# Patient Record
Sex: Female | Born: 1945 | Race: White | Hispanic: No | Marital: Married | Smoking: Former smoker
Health system: Southern US, Community
[De-identification: ages and names within clinical notes are randomized; demographics above are authoritative.]

## PROBLEM LIST (undated history)

## (undated) DIAGNOSIS — I10 Essential (primary) hypertension: Secondary | ICD-10-CM

## (undated) DIAGNOSIS — E7800 Pure hypercholesterolemia, unspecified: Secondary | ICD-10-CM

## (undated) HISTORY — DX: Essential (primary) hypertension: I10

## (undated) HISTORY — DX: Pure hypercholesterolemia, unspecified: E78.00

---

## 1979-03-01 HISTORY — PX: BACK SURGERY: SHX140

## 1998-12-04 ENCOUNTER — Ambulatory Visit (HOSPITAL_COMMUNITY): Admission: RE | Admit: 1998-12-04 | Discharge: 1998-12-04 | Payer: Self-pay | Admitting: Gastroenterology

## 1999-12-29 ENCOUNTER — Encounter: Payer: Self-pay | Admitting: Gynecology

## 1999-12-29 ENCOUNTER — Encounter: Admission: RE | Admit: 1999-12-29 | Discharge: 1999-12-29 | Payer: Self-pay | Admitting: Gynecology

## 2000-12-04 ENCOUNTER — Other Ambulatory Visit: Admission: RE | Admit: 2000-12-04 | Discharge: 2000-12-04 | Payer: Self-pay | Admitting: Gynecology

## 2002-01-09 ENCOUNTER — Other Ambulatory Visit: Admission: RE | Admit: 2002-01-09 | Discharge: 2002-01-09 | Payer: Self-pay | Admitting: Gynecology

## 2002-08-22 ENCOUNTER — Encounter: Admission: RE | Admit: 2002-08-22 | Discharge: 2002-08-22 | Payer: Self-pay | Admitting: Gynecology

## 2002-08-22 ENCOUNTER — Encounter: Payer: Self-pay | Admitting: Gynecology

## 2003-01-29 ENCOUNTER — Other Ambulatory Visit: Admission: RE | Admit: 2003-01-29 | Discharge: 2003-01-29 | Payer: Self-pay | Admitting: Gynecology

## 2004-03-24 ENCOUNTER — Other Ambulatory Visit: Admission: RE | Admit: 2004-03-24 | Discharge: 2004-03-24 | Payer: Self-pay | Admitting: Gynecology

## 2005-03-24 ENCOUNTER — Encounter: Admission: RE | Admit: 2005-03-24 | Discharge: 2005-03-24 | Payer: Self-pay | Admitting: Gynecology

## 2005-04-07 ENCOUNTER — Other Ambulatory Visit: Admission: RE | Admit: 2005-04-07 | Discharge: 2005-04-07 | Payer: Self-pay | Admitting: Gynecology

## 2005-05-11 ENCOUNTER — Inpatient Hospital Stay (HOSPITAL_COMMUNITY): Admission: RE | Admit: 2005-05-11 | Discharge: 2005-05-15 | Payer: Self-pay | Admitting: Orthopedic Surgery

## 2006-02-28 HISTORY — PX: REPLACEMENT TOTAL KNEE: SUR1224

## 2006-04-27 ENCOUNTER — Encounter: Admission: RE | Admit: 2006-04-27 | Discharge: 2006-04-27 | Payer: Self-pay | Admitting: Gynecology

## 2006-05-29 ENCOUNTER — Other Ambulatory Visit: Admission: RE | Admit: 2006-05-29 | Discharge: 2006-05-29 | Payer: Self-pay | Admitting: Gynecology

## 2007-03-19 IMAGING — CR DG CHEST 2V
2 series · 2 of 2 positions shown · non-contrast
Comparison: None.

CLINICAL DATA: Osteoarthritis the right knee. Preoperative respiratory
evaluation.

CHEST - 2 VIEW  05/06/2005:

[view not recorded (1 of 2)]
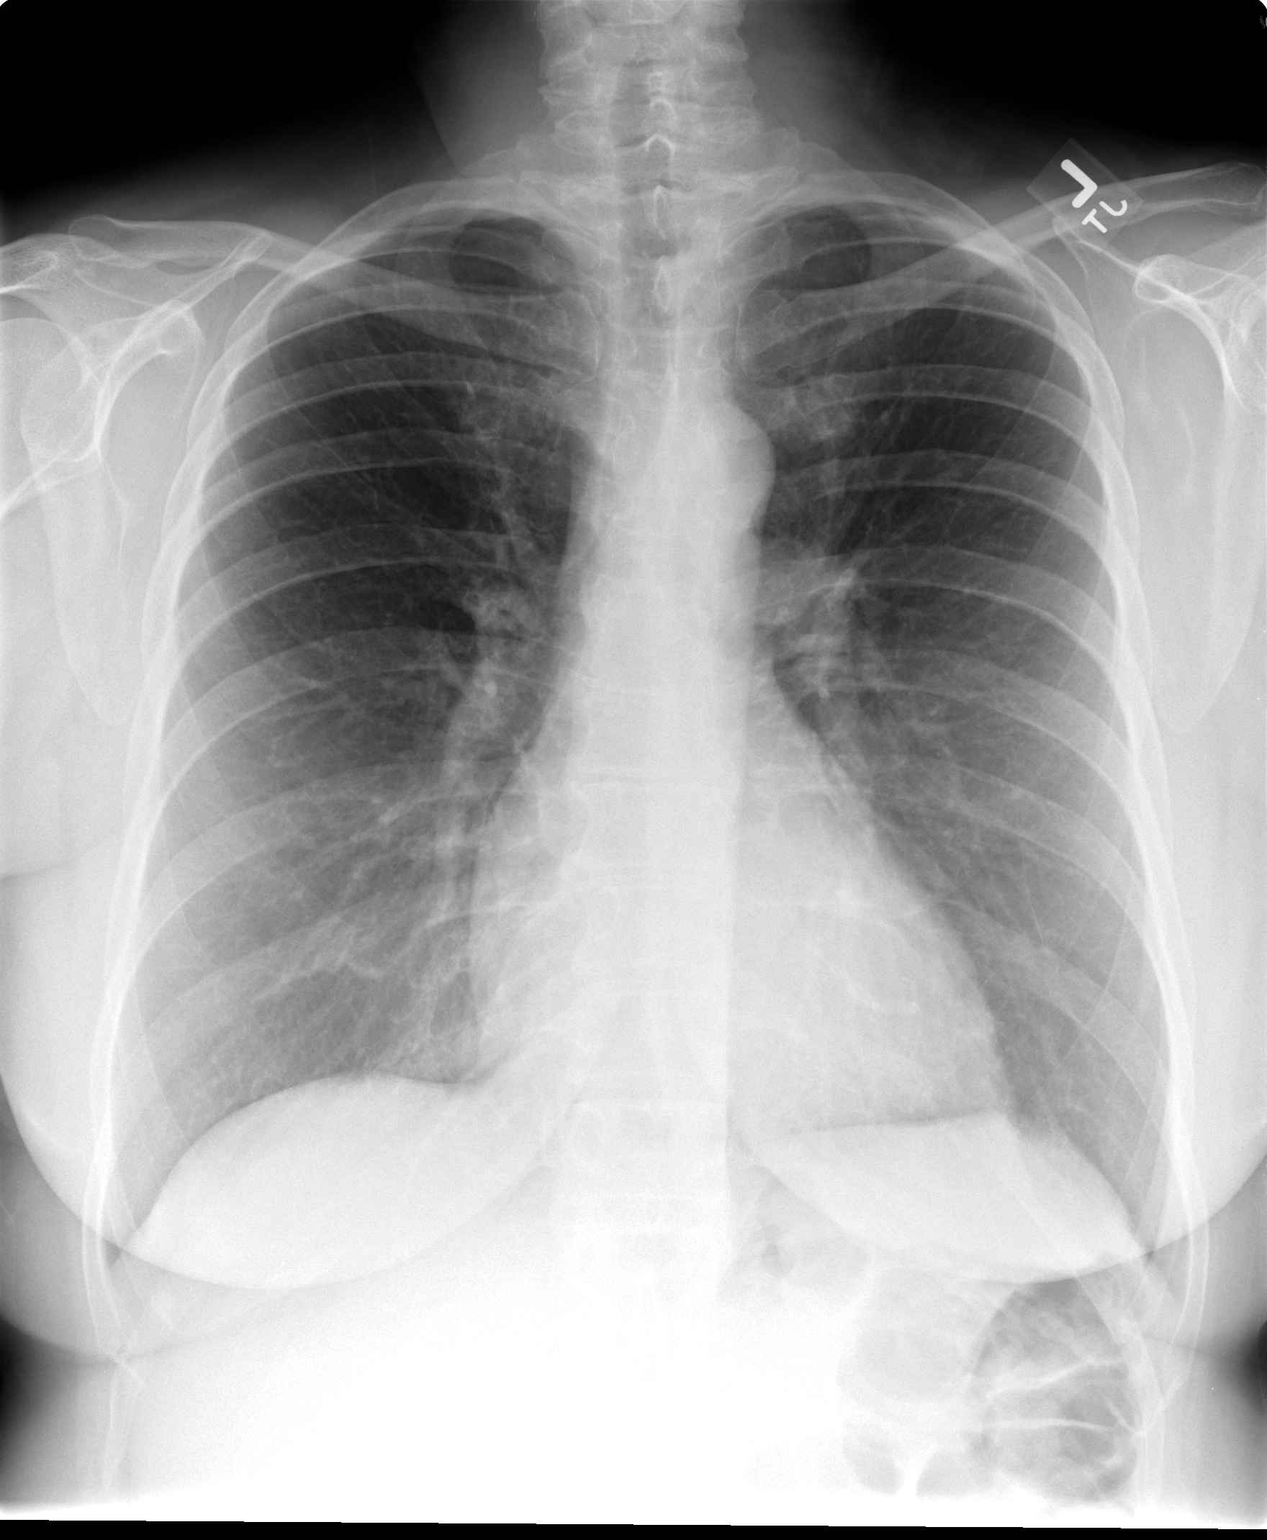

[view not recorded (2 of 2)]
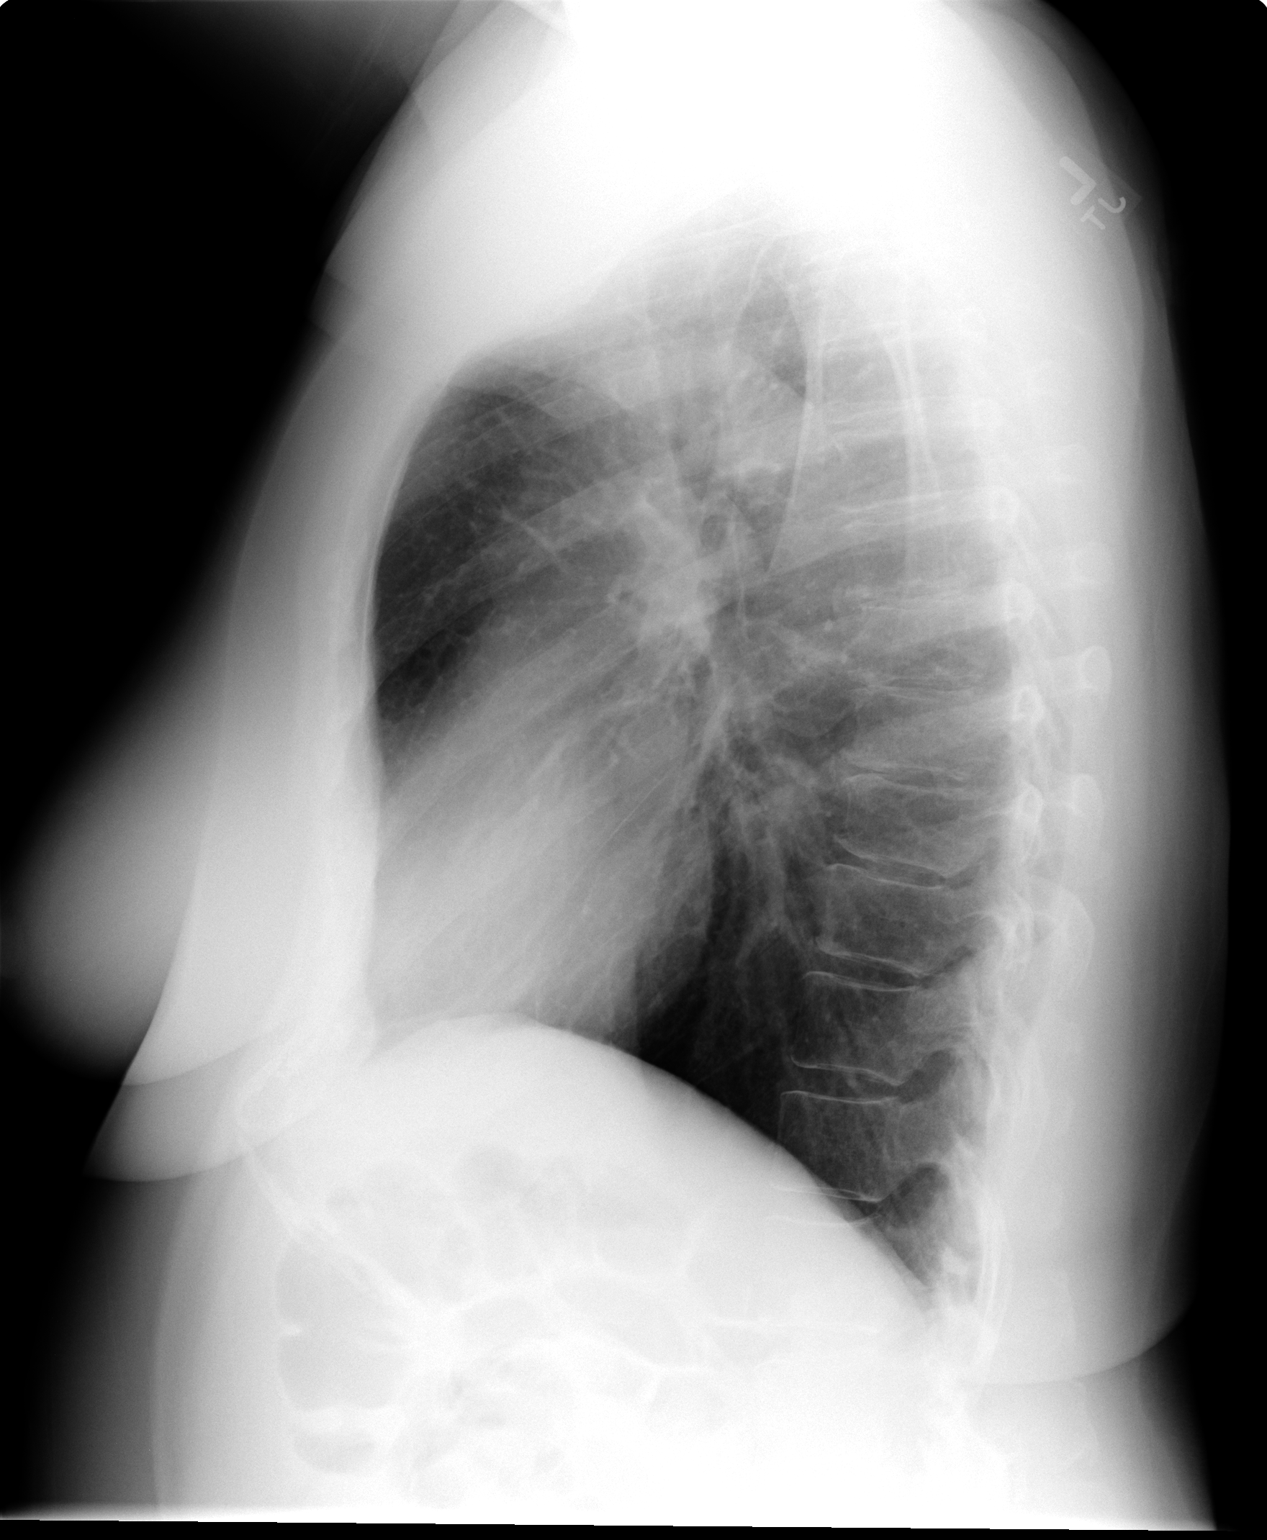

[2 of 2 positions shown; findings below may reference images not displayed]

FINDINGS: The cardiomediastinal silhouette is unremarkable. The lungs are
clear. There are no pleural effusions. Minimal degenerative changes are present
in the midthoracic spine.
IMPRESSION: No acute cardiopulmonary disease.

## 2007-06-04 ENCOUNTER — Encounter: Admission: RE | Admit: 2007-06-04 | Discharge: 2007-06-04 | Payer: Self-pay | Admitting: Gynecology

## 2008-07-07 ENCOUNTER — Encounter: Admission: RE | Admit: 2008-07-07 | Discharge: 2008-07-07 | Payer: Self-pay | Admitting: Gynecology

## 2009-07-30 ENCOUNTER — Encounter: Admission: RE | Admit: 2009-07-30 | Discharge: 2009-07-30 | Payer: Self-pay | Admitting: Gynecology

## 2010-07-16 NOTE — Discharge Summary (Signed)
NAMESANDY, HAYE             ACCOUNT NO.:  [id number]   MEDICAL RECORD NO.:  [id number]          PATIENT TYPE:  INP   LOCATION:  1616                         FACILITY:  [facility]   PHYSICIAN:  Georges Lynch. Gioffre, M.D.DATE OF BIRTH:  06-09-1945   DATE OF ADMISSION:  05/11/2005  DATE OF DISCHARGE:  05/15/2005                                 DISCHARGE SUMMARY   ADMISSION DIAGNOSES:  1.  End-stage osteoarthritis, right knee.  2.  Osteoarthritis, left knee.  3.  Hypertension.  4.  Hypothyroidism.   DISCHARGE DIAGNOSES:  1.  Right total knee arthroplasty.  2.  Asymptomatic postoperative blood loss anemia.  3.  History of osteoarthritis, left knee.  4.  History of hypertension.  5.  History of hypothyroidism.   HISTORY OF PRESENT ILLNESS:  The patient is a 65 year old female with a  several year history of progressively worsening right knee pain that bothers  her at night.  She has pain with ambulation.  She has had multiple  arthroscopies in the past without any improvement.  X-rays revealed bone-on-  bone with end-stage osteoarthritis.   ALLERGIES:  NO KNOWN DRUG ALLERGIES.   CURRENT MEDICATIONS:  1.  Hydrochlorothiazide.  2.  Levothyroxine.  3.  Citracal with calcium.  4.  Multivitamins.   SURGICAL PROCEDURE:  On April 28, 2005, the patient was taken to the OR by  Dr. Ranee Gosselin, assisted by Oneida Alar, PA-C, and underwent general  anesthesia.  The patient underwent a right total knee arthroplasty using  DePuy system.  The following components were implanted:  A size 3 femoral  component, a size 3 keeled tibial tray, a size 38 mm three-pegged patella,  size 3, 10 mm polyethylene bearing.  All components were implanted with poly  methyl methacrylate with vancomycin.  The patient tolerated the procedure  well.  There were no complications.   CONSULTS:  The following routine consults were requested:  Physical therapy,  occupational therapy, and case management.   HOSPITAL  COURSE:  On May 11, 2005, the patient was admitted to [facility] under the care of Dr. Darrelyn Hillock.  The patient was taken to the OR  where a right total knee arthroplasty was performed without any  complications.  The patient tolerated the procedure well.  She was  transferred to the recovery room and then to the orthopedic floor for  routine postop protocol for total knees on IV antibiotics, pain medicines,  start Coumadin for DVT prophylaxis and heparin subcutaneous.   The patient then incurred four days postoperative course on orthopedic floor  in which the patient did very well without any significant untoward events.  Her vital signs remained stable.  She did develop some slight postoperative  blood loss anemia with hemoglobin dropping to 9.7 but her vital signs  remained stable.  She tolerated it well with physical therapy so this was  allowed to self-correct with supplementation.  The patient's wound remained  benign without any signs of infection.  Leg remained neurovascularly intact  and the patient worked very well with physical therapy.  She was able to  transition  from IV medications to p.o. medications well without any  complications.  It was felt on postoperative day #4 she was orthopedically  and medically stable and ready for discharge home so arrangements were made  and she will continue with outpatient physical therapy per protocol.   LABS:  CBC on March 17, WBC 9.1, hemoglobin 9.1, hematocrit 26.8, platelets  210.  INR of 2 on March 18.  Routine chemistries on March 17 showed sodium  137, potassium of 3.4, glucose of 116, BUN 9, creatinine 0.8.  Her elevated  glucose was felt due to inactivity and routine postop surgical stress.   Urinalysis on preop showed that she had some cloudy urine, small leukocyte  esterase, few epithelial cells 0-2 WBC.  She was treated with routine  postoperative IV antibiotics for 24 hours.  EKG on admission was normal  sinus  rhythm.  She had a chest x-ray for preop which showed no acute  cardiopulmonary disease.   MEDICATIONS UPON DISCHARGE:  1.  Lisinopril 20 mg p.o. daily.  2.  Hydrochlorothiazide 12.5 mg daily.  3.  Levothyroxine 112 mcg per day.  4.  Multivitamin one tablet a day.  5.  Calcium carbonate with vitamin D two tablets a day.  6.  Ferrous sulfate 325 mg three times a day.  7.  Reglan 10 mg every 8 hours p.r.n.  8.  Phenergan 25 mg p.o. q.6h. p.r.n.  9.  Robaxin 500 mg p.o. q.6h. p.r.n.  10. Percocet one or two tablets every 4-6 hours p.r.n. pain.  11. Coumadin 3 mg a day.  12. Heparin had been discontinued since INR was 2.   DISCHARGE INSTRUCTIONS:  1.  Diet - no restrictions.  2.  Activity - the patient is to ambulate with assistance with the use of a      walker and as instructed by physical therapy.  3.  Wound care - the patient is to change dressings daily.  4.  Medications - the patient is to resume her previous home medications      with the addition of Coumadin 5 mg a day unless changed by [place]      pharmacist.  5.  Robaxin 500 mg one every 8 hours for muscle spasms if needed.  6.  Percocet one or two every 4-6 hours for muscle pain if needed.  7.  Follow up - with Dr. Darrelyn Hillock two weeks from surgery - please call [phone] for an appointment.  The patient      is to continue with routine outpatient total knee protocol with Genevieve Norlander      for physical therapy and Coumadin checking.   PATIENT'S CONDITION UPON DISCHARGE TO HOME:  Listed as improved and good.      Jamelle Rushing, P.A.    ______________________________  Georges Lynch Darrelyn Hillock, M.D.    RWK/MEDQ  D:  05/27/2005  T:  05/30/2005  Job:  [id number]

## 2010-07-16 NOTE — Op Note (Signed)
Lori Sandoval, FRIEND             ACCOUNT NO.:  [id number]   MEDICAL RECORD NO.:  [id number]          PATIENT TYPE:  INP   LOCATION:  1616                         FACILITY:  [facility]   PHYSICIAN:  Georges Lynch. Gioffre, M.D.DATE OF BIRTH:  1945-06-27   DATE OF PROCEDURE:  05/11/2005  DATE OF DISCHARGE:                                 OPERATIVE REPORT   Dr.   Alvy Beal:  Georges Lynch. Darrelyn Hillock, M.D.   ASSISTANT:  Jamelle Rushing, P.A.   PREOPERATIVE DIAGNOSIS:  Severe degenerative arthritis of the right knee.   POSTOPERATIVE DIAGNOSIS:  Severe degenerative arthritis of the right knee.   OPERATION:  Right total knee arthroplasty utilizing the DePuy system  utilizing a rotating platform and tibial component.  All three components  were cemented.  Vancomycin was used as cement.  The sizes used were as  follows.  The tibial tray with a size 3. The femur was a size 3 right.  Posterior cruciate sacrificing type prosthesis.  The patella was a size 38.  The tibial insert was a size 3, 10 mm thickness.   DESCRIPTION OF PROCEDURE:  Under general anesthesia, routine orthopedic  prepping and draping of the right lower was extremity carried out. She had 1  gram of IV Ancef preoperatively.  At this time, the leg  was exsanguinated  with the Esmarch.  The Tourniquet was elevated to 375 mmHg.  An anterior  approach of the left knee was carried out.  Bleeders identified and  cauterized.  Two flaps were created, and self-retaining retractors were  inserted.  I then carried out a median parapatellar approach.  I reflected  the patella laterally, flexed the knee and did lateral and medial  meniscectomies and excised the anterior and posterior cruciate ligaments.  I  then went down and did a thorough synovectomy.  She had a severe synovitis.  Following that, I then made my removed all the spurs from the femur, tibia  and patella.  I then made initial drill hole at the intercondylar notch.  At  this time  intramedullary guide wire was inserted.  We removed 12 mm  thickness off the distal femur because of her contracture.  Following that,  the #2 jig was inserted for a size 3, and we did our anterior and posterior  chamfer cuts for a size 3 right femur.  Following that, we then prepared the  tibia.  We removed 4 mm thickness off the affected side of the tibial  plateau.  We used the tibial plateau actually the lateral aspect was our  baseline because it was more worn laterally and medially.  We removed a  total 4 mm thickness off the proximal tibia.  Following that, we then cut  our keel cut in the usual fashion. We then completed our preparation of the  femur as well, cutting our notch cut out.  We thoroughly debrided out the  soft tissue.  We inspected the posterior condyles of by inserting a lamina  spreader to examine the condyles and then remove all loose fragments.  We  thoroughly water picked out the knee went  through range of motion and felt  that a 10 mm thickness size 3 tibial component was our best choice in this  case.  We had good motion.  We then prepared our patella for a size 38  patella.  We did a resurfacing type patella in the usual fashion.  The  appropriate measurements were taken.  We then made three drill holes in the  patella for a 38 mm patella.  Following that, we thoroughly water picked out  the knee after we removed the trial components.  We dried the knee out and  cemented all three components in simultaneously.  At that particular time,  all loose pieces of cement were removed.  We removed the trial tibial insert  and then examined the posterior aspect in the knee to make sure there were  no other loose fragments of bone.  We water picked out the knee again and we  then dried the knee  out and inserted our permanent size 3, 10 mm thickness tibial rotating  platform.  The knee was reduced, taken through motion. We had excellent  function.  We then inserted a  Hemovac drain and closed the knee in layers  over a Hemovac drain.  Sterile Neosporin dressings were applied.  The  patient had 1 gram of IV Ancef preoperatively.           ______________________________  Georges Lynch Darrelyn Hillock, M.D.     RAG/MEDQ  D:  05/11/2005  T:  05/12/2005  Job:  [id number]

## 2010-10-11 ENCOUNTER — Other Ambulatory Visit: Payer: Self-pay | Admitting: Gynecology

## 2010-10-11 DIAGNOSIS — Z1231 Encounter for screening mammogram for malignant neoplasm of breast: Secondary | ICD-10-CM

## 2010-10-13 ENCOUNTER — Ambulatory Visit
Admission: RE | Admit: 2010-10-13 | Discharge: 2010-10-13 | Disposition: A | Discharge status: Home / Self Care | Payer: Medicare Other | Source: Ambulatory Visit | Attending: Gynecology | Admitting: Gynecology

## 2010-10-13 DIAGNOSIS — Z1231 Encounter for screening mammogram for malignant neoplasm of breast: Secondary | ICD-10-CM

## 2011-06-07 DIAGNOSIS — H40019 Open angle with borderline findings, low risk, unspecified eye: Secondary | ICD-10-CM | POA: Diagnosis not present

## 2011-10-20 ENCOUNTER — Other Ambulatory Visit: Payer: Self-pay | Admitting: Gynecology

## 2011-10-20 DIAGNOSIS — Z1231 Encounter for screening mammogram for malignant neoplasm of breast: Secondary | ICD-10-CM

## 2011-11-02 DIAGNOSIS — Z01419 Encounter for gynecological examination (general) (routine) without abnormal findings: Secondary | ICD-10-CM | POA: Diagnosis not present

## 2011-11-02 DIAGNOSIS — N8184 Pelvic muscle wasting: Secondary | ICD-10-CM | POA: Diagnosis not present

## 2011-11-02 DIAGNOSIS — M899 Disorder of bone, unspecified: Secondary | ICD-10-CM | POA: Diagnosis not present

## 2011-11-14 ENCOUNTER — Ambulatory Visit
Admission: RE | Admit: 2011-11-14 | Discharge: 2011-11-14 | Disposition: A | Discharge status: Home / Self Care | Payer: Medicare Other | Source: Ambulatory Visit | Attending: Gynecology | Admitting: Gynecology

## 2011-11-14 DIAGNOSIS — Z1331 Encounter for screening for depression: Secondary | ICD-10-CM | POA: Diagnosis not present

## 2011-11-14 DIAGNOSIS — Z1231 Encounter for screening mammogram for malignant neoplasm of breast: Secondary | ICD-10-CM

## 2011-11-14 DIAGNOSIS — E7800 Pure hypercholesterolemia, unspecified: Secondary | ICD-10-CM | POA: Diagnosis not present

## 2011-11-14 DIAGNOSIS — M810 Age-related osteoporosis without current pathological fracture: Secondary | ICD-10-CM | POA: Diagnosis not present

## 2011-11-14 DIAGNOSIS — E039 Hypothyroidism, unspecified: Secondary | ICD-10-CM | POA: Diagnosis not present

## 2011-11-14 DIAGNOSIS — Z0000 Encounter for general adult medical examination without abnormal findings: Secondary | ICD-10-CM | POA: Diagnosis not present

## 2011-11-14 DIAGNOSIS — Z79899 Other long term (current) drug therapy: Secondary | ICD-10-CM | POA: Diagnosis not present

## 2011-11-14 DIAGNOSIS — I10 Essential (primary) hypertension: Secondary | ICD-10-CM | POA: Diagnosis not present

## 2011-11-24 DIAGNOSIS — Z23 Encounter for immunization: Secondary | ICD-10-CM | POA: Diagnosis not present

## 2012-11-15 DIAGNOSIS — E7800 Pure hypercholesterolemia, unspecified: Secondary | ICD-10-CM | POA: Diagnosis not present

## 2012-11-15 DIAGNOSIS — E039 Hypothyroidism, unspecified: Secondary | ICD-10-CM | POA: Diagnosis not present

## 2012-11-15 DIAGNOSIS — Z0000 Encounter for general adult medical examination without abnormal findings: Secondary | ICD-10-CM | POA: Diagnosis not present

## 2012-11-15 DIAGNOSIS — M810 Age-related osteoporosis without current pathological fracture: Secondary | ICD-10-CM | POA: Diagnosis not present

## 2012-11-15 DIAGNOSIS — Z23 Encounter for immunization: Secondary | ICD-10-CM | POA: Diagnosis not present

## 2012-11-26 ENCOUNTER — Other Ambulatory Visit: Payer: Self-pay

## 2012-11-26 DIAGNOSIS — Z1231 Encounter for screening mammogram for malignant neoplasm of breast: Secondary | ICD-10-CM

## 2012-11-29 DIAGNOSIS — Z23 Encounter for immunization: Secondary | ICD-10-CM | POA: Diagnosis not present

## 2012-12-17 DIAGNOSIS — H40029 Open angle with borderline findings, high risk, unspecified eye: Secondary | ICD-10-CM | POA: Diagnosis not present

## 2012-12-19 ENCOUNTER — Ambulatory Visit
Admission: RE | Admit: 2012-12-19 | Discharge: 2012-12-19 | Disposition: A | Discharge status: Home / Self Care | Payer: Medicare Other | Source: Ambulatory Visit

## 2012-12-19 DIAGNOSIS — Z1231 Encounter for screening mammogram for malignant neoplasm of breast: Secondary | ICD-10-CM | POA: Diagnosis not present

## 2012-12-19 DIAGNOSIS — M810 Age-related osteoporosis without current pathological fracture: Secondary | ICD-10-CM | POA: Diagnosis not present

## 2013-05-15 DIAGNOSIS — Z79899 Other long term (current) drug therapy: Secondary | ICD-10-CM | POA: Diagnosis not present

## 2013-05-15 DIAGNOSIS — E039 Hypothyroidism, unspecified: Secondary | ICD-10-CM | POA: Diagnosis not present

## 2013-05-15 DIAGNOSIS — M810 Age-related osteoporosis without current pathological fracture: Secondary | ICD-10-CM | POA: Diagnosis not present

## 2013-05-15 DIAGNOSIS — I10 Essential (primary) hypertension: Secondary | ICD-10-CM | POA: Diagnosis not present

## 2013-05-15 DIAGNOSIS — Z23 Encounter for immunization: Secondary | ICD-10-CM | POA: Diagnosis not present

## 2013-05-15 DIAGNOSIS — E7800 Pure hypercholesterolemia, unspecified: Secondary | ICD-10-CM | POA: Diagnosis not present

## 2013-11-19 DIAGNOSIS — E559 Vitamin D deficiency, unspecified: Secondary | ICD-10-CM | POA: Diagnosis not present

## 2013-11-19 DIAGNOSIS — E039 Hypothyroidism, unspecified: Secondary | ICD-10-CM | POA: Diagnosis not present

## 2013-11-19 DIAGNOSIS — Z0000 Encounter for general adult medical examination without abnormal findings: Secondary | ICD-10-CM | POA: Diagnosis not present

## 2013-11-19 DIAGNOSIS — I10 Essential (primary) hypertension: Secondary | ICD-10-CM | POA: Diagnosis not present

## 2013-11-19 DIAGNOSIS — E7800 Pure hypercholesterolemia, unspecified: Secondary | ICD-10-CM | POA: Diagnosis not present

## 2013-11-19 DIAGNOSIS — Z1331 Encounter for screening for depression: Secondary | ICD-10-CM | POA: Diagnosis not present

## 2013-11-19 DIAGNOSIS — M810 Age-related osteoporosis without current pathological fracture: Secondary | ICD-10-CM | POA: Diagnosis not present

## 2013-11-19 DIAGNOSIS — Z79899 Other long term (current) drug therapy: Secondary | ICD-10-CM | POA: Diagnosis not present

## 2013-12-12 DIAGNOSIS — Z23 Encounter for immunization: Secondary | ICD-10-CM | POA: Diagnosis not present

## 2014-01-02 ENCOUNTER — Other Ambulatory Visit: Payer: Self-pay

## 2014-01-02 DIAGNOSIS — Z1231 Encounter for screening mammogram for malignant neoplasm of breast: Secondary | ICD-10-CM

## 2014-01-28 ENCOUNTER — Ambulatory Visit
Admission: RE | Admit: 2014-01-28 | Discharge: 2014-01-28 | Disposition: A | Discharge status: Home / Self Care | Payer: Medicare Other | Source: Ambulatory Visit

## 2014-01-28 DIAGNOSIS — Z1231 Encounter for screening mammogram for malignant neoplasm of breast: Secondary | ICD-10-CM

## 2014-02-25 DIAGNOSIS — E780 Pure hypercholesterolemia: Secondary | ICD-10-CM | POA: Diagnosis not present

## 2014-02-25 DIAGNOSIS — E559 Vitamin D deficiency, unspecified: Secondary | ICD-10-CM | POA: Diagnosis not present

## 2014-02-25 DIAGNOSIS — M810 Age-related osteoporosis without current pathological fracture: Secondary | ICD-10-CM | POA: Diagnosis not present

## 2014-02-25 DIAGNOSIS — I10 Essential (primary) hypertension: Secondary | ICD-10-CM | POA: Diagnosis not present

## 2014-02-25 DIAGNOSIS — E039 Hypothyroidism, unspecified: Secondary | ICD-10-CM | POA: Diagnosis not present

## 2014-05-20 DIAGNOSIS — E780 Pure hypercholesterolemia: Secondary | ICD-10-CM | POA: Diagnosis not present

## 2014-05-20 DIAGNOSIS — M810 Age-related osteoporosis without current pathological fracture: Secondary | ICD-10-CM | POA: Diagnosis not present

## 2014-05-20 DIAGNOSIS — E039 Hypothyroidism, unspecified: Secondary | ICD-10-CM | POA: Diagnosis not present

## 2014-05-20 DIAGNOSIS — E559 Vitamin D deficiency, unspecified: Secondary | ICD-10-CM | POA: Diagnosis not present

## 2014-05-20 DIAGNOSIS — L989 Disorder of the skin and subcutaneous tissue, unspecified: Secondary | ICD-10-CM | POA: Diagnosis not present

## 2014-05-20 DIAGNOSIS — Z8679 Personal history of other diseases of the circulatory system: Secondary | ICD-10-CM | POA: Diagnosis not present

## 2014-06-18 DIAGNOSIS — C4492 Squamous cell carcinoma of skin, unspecified: Secondary | ICD-10-CM | POA: Diagnosis not present

## 2014-06-18 DIAGNOSIS — D485 Neoplasm of uncertain behavior of skin: Secondary | ICD-10-CM | POA: Diagnosis not present

## 2014-06-18 DIAGNOSIS — L570 Actinic keratosis: Secondary | ICD-10-CM | POA: Diagnosis not present

## 2014-06-18 DIAGNOSIS — L821 Other seborrheic keratosis: Secondary | ICD-10-CM | POA: Diagnosis not present

## 2014-10-08 DIAGNOSIS — L570 Actinic keratosis: Secondary | ICD-10-CM | POA: Diagnosis not present

## 2014-10-08 DIAGNOSIS — L719 Rosacea, unspecified: Secondary | ICD-10-CM | POA: Diagnosis not present

## 2014-10-08 DIAGNOSIS — C4492 Squamous cell carcinoma of skin, unspecified: Secondary | ICD-10-CM | POA: Diagnosis not present

## 2014-10-08 DIAGNOSIS — Z872 Personal history of diseases of the skin and subcutaneous tissue: Secondary | ICD-10-CM | POA: Diagnosis not present

## 2014-10-08 DIAGNOSIS — L821 Other seborrheic keratosis: Secondary | ICD-10-CM | POA: Diagnosis not present

## 2014-10-08 DIAGNOSIS — D485 Neoplasm of uncertain behavior of skin: Secondary | ICD-10-CM | POA: Diagnosis not present

## 2014-10-08 DIAGNOSIS — L814 Other melanin hyperpigmentation: Secondary | ICD-10-CM | POA: Diagnosis not present

## 2014-11-26 ENCOUNTER — Other Ambulatory Visit (HOSPITAL_COMMUNITY)
Admission: RE | Admit: 2014-11-26 | Discharge: 2014-11-26 | Disposition: A | Discharge status: Home / Self Care | Payer: Medicare Other | Source: Ambulatory Visit | Attending: Family Medicine | Admitting: Family Medicine

## 2014-11-26 DIAGNOSIS — Z0000 Encounter for general adult medical examination without abnormal findings: Secondary | ICD-10-CM | POA: Diagnosis not present

## 2014-11-26 DIAGNOSIS — E039 Hypothyroidism, unspecified: Secondary | ICD-10-CM | POA: Diagnosis not present

## 2014-11-26 DIAGNOSIS — Z124 Encounter for screening for malignant neoplasm of cervix: Secondary | ICD-10-CM | POA: Insufficient documentation

## 2014-11-26 DIAGNOSIS — E559 Vitamin D deficiency, unspecified: Secondary | ICD-10-CM | POA: Diagnosis not present

## 2014-11-26 DIAGNOSIS — Z1389 Encounter for screening for other disorder: Secondary | ICD-10-CM | POA: Diagnosis not present

## 2014-11-26 DIAGNOSIS — M810 Age-related osteoporosis without current pathological fracture: Secondary | ICD-10-CM | POA: Diagnosis not present

## 2014-11-26 DIAGNOSIS — I10 Essential (primary) hypertension: Secondary | ICD-10-CM | POA: Diagnosis not present

## 2014-11-26 DIAGNOSIS — E780 Pure hypercholesterolemia: Secondary | ICD-10-CM | POA: Diagnosis not present

## 2014-11-27 DIAGNOSIS — Z23 Encounter for immunization: Secondary | ICD-10-CM | POA: Diagnosis not present

## 2015-03-17 ENCOUNTER — Other Ambulatory Visit: Payer: Self-pay

## 2015-03-17 DIAGNOSIS — Z1231 Encounter for screening mammogram for malignant neoplasm of breast: Secondary | ICD-10-CM

## 2015-04-10 DIAGNOSIS — H524 Presbyopia: Secondary | ICD-10-CM | POA: Diagnosis not present

## 2015-04-10 DIAGNOSIS — H5213 Myopia, bilateral: Secondary | ICD-10-CM | POA: Diagnosis not present

## 2015-04-10 DIAGNOSIS — Z135 Encounter for screening for eye and ear disorders: Secondary | ICD-10-CM | POA: Diagnosis not present

## 2015-04-10 DIAGNOSIS — H25813 Combined forms of age-related cataract, bilateral: Secondary | ICD-10-CM | POA: Diagnosis not present

## 2015-04-15 ENCOUNTER — Ambulatory Visit
Admission: RE | Admit: 2015-04-15 | Discharge: 2015-04-15 | Disposition: A | Discharge status: Home / Self Care | Payer: Medicare Other | Source: Ambulatory Visit

## 2015-04-15 DIAGNOSIS — Z1231 Encounter for screening mammogram for malignant neoplasm of breast: Secondary | ICD-10-CM

## 2015-05-18 DIAGNOSIS — Z800 Family history of malignant neoplasm of digestive organs: Secondary | ICD-10-CM | POA: Diagnosis not present

## 2015-05-18 DIAGNOSIS — Z86010 Personal history of colonic polyps: Secondary | ICD-10-CM | POA: Diagnosis not present

## 2015-05-18 DIAGNOSIS — K640 First degree hemorrhoids: Secondary | ICD-10-CM | POA: Diagnosis not present

## 2015-10-21 DIAGNOSIS — Z872 Personal history of diseases of the skin and subcutaneous tissue: Secondary | ICD-10-CM | POA: Diagnosis not present

## 2015-10-21 DIAGNOSIS — L570 Actinic keratosis: Secondary | ICD-10-CM | POA: Diagnosis not present

## 2015-10-21 DIAGNOSIS — D225 Melanocytic nevi of trunk: Secondary | ICD-10-CM | POA: Diagnosis not present

## 2015-10-21 DIAGNOSIS — L821 Other seborrheic keratosis: Secondary | ICD-10-CM | POA: Diagnosis not present

## 2015-10-28 DIAGNOSIS — H6590 Unspecified nonsuppurative otitis media, unspecified ear: Secondary | ICD-10-CM | POA: Diagnosis not present

## 2015-11-11 DIAGNOSIS — H903 Sensorineural hearing loss, bilateral: Secondary | ICD-10-CM | POA: Diagnosis not present

## 2015-11-11 DIAGNOSIS — H9312 Tinnitus, left ear: Secondary | ICD-10-CM | POA: Diagnosis not present

## 2015-11-12 ENCOUNTER — Encounter: Payer: Self-pay | Admitting: Neurology

## 2015-11-12 ENCOUNTER — Ambulatory Visit (HOSPITAL_COMMUNITY): Payer: Medicare Other

## 2015-11-12 ENCOUNTER — Ambulatory Visit (INDEPENDENT_AMBULATORY_CARE_PROVIDER_SITE_OTHER): Payer: Medicare Other | Admitting: Neurology

## 2015-11-12 ENCOUNTER — Telehealth: Payer: Self-pay | Admitting: *Deleted

## 2015-11-12 VITALS — BP 154/74 | HR 64 | Ht 67.0 in | Wt 169.4 lb

## 2015-11-12 DIAGNOSIS — R42 Dizziness and giddiness: Secondary | ICD-10-CM

## 2015-11-12 DIAGNOSIS — R4789 Other speech disturbances: Secondary | ICD-10-CM

## 2015-11-12 DIAGNOSIS — I720 Aneurysm of carotid artery: Secondary | ICD-10-CM

## 2015-11-12 DIAGNOSIS — R413 Other amnesia: Secondary | ICD-10-CM | POA: Diagnosis not present

## 2015-11-12 DIAGNOSIS — H905 Unspecified sensorineural hearing loss: Secondary | ICD-10-CM

## 2015-11-12 DIAGNOSIS — R51 Headache: Secondary | ICD-10-CM | POA: Diagnosis not present

## 2015-11-12 DIAGNOSIS — H93A9 Pulsatile tinnitus, unspecified ear: Secondary | ICD-10-CM

## 2015-11-12 DIAGNOSIS — I671 Cerebral aneurysm, nonruptured: Secondary | ICD-10-CM

## 2015-11-12 DIAGNOSIS — I10 Essential (primary) hypertension: Secondary | ICD-10-CM

## 2015-11-12 DIAGNOSIS — H9190 Unspecified hearing loss, unspecified ear: Secondary | ICD-10-CM

## 2015-11-12 DIAGNOSIS — R519 Headache, unspecified: Secondary | ICD-10-CM

## 2015-11-12 NOTE — Progress Notes (Signed)
[facility]    Provider:  Dr Jaynee Eagles Referring Provider: Minna Merritts MD Primary Care Physician:  Gerrit Heck, MD  CC:  Pulsatile tinnitus  HPI:  Lori Sandoval is a 70 y.o. female here as a referral from Dr. Ernesto Rutherford for pulsatile tinnitus and headache. PMHx of HTN and high cholesterol.  She has had a pulsating in her ear for the last 2 months without any inciting events or head trauma. Started slowly and now it is constant worse when laying down, exceedingly loud she can;t even sleep, like a heartbeat. She has also had new onset headache but not pain in the ear just a loud pulsating strange in the ear. She also has new headaches which have been off and on for a few months. She has a history of migraines but has not had one in years and this headache is of new quality, feels different and not the same as her previous remote migraines. The headaches are in the temple area, these are new, pressure and pulses. No blurry vision with the headaches, no jaw pain. She has had word-finding difficulty, memory changes. Mother with dementia. She reports worsening memory. No vision changes, no new neck pain, no jaw pain, no new pain in the shoulder or shoulder/hip girdle, no fevers. No Fhx of aneurysms. Laying down makes symptoms worse, walking around during the day it is less. No ear pain or ear drainage. She was evaluated by ENT and ear is fine. No other associated symptoms or modifying factors. No fhx of aneurysms. Some dizziness.   Reviewed notes, labs and imaging from outside physicians, which showed:  Personally reviewed images and agree with the following:  CTA NECK  Aortic arch: Atheromatous wall thickening without acute finding. Three vessel branching  Right carotid system: Mild atheromatous changes at the bifurcation without stenosis. No dissection or plaque ulceration.  Left carotid system: Mild atheromatous wall thickening at the bifurcation,  noncalcified without stenosis. No dissection or ulceration. No beading.  Vertebral arteries:Prominent mixed density plaque on the proximal left subclavian artery without flow limiting stenosis. Left dominant vertebral artery. Both vessels are smooth and widely patent to the dura.  Skeleton: Degenerative changes in the cervical spine. No acute or aggressive process.  Other neck: No incidental mass or adenopathy detected.  Upper chest: Clear apical lungs  CTA HEAD  Anterior circulation: Atheromatous changes in the bilateral carotid siphons without flow limiting stenosis. No notable irregularity or narrowing at the skullbase to explain symptoms. Normal course of the left carotid canal. Hypoplastic right A1 segment. Sizable bilateral posterior communicating arteries with fetal type PCA anatomy. Present anterior communicating artery. No major branch occlusion or flow limiting stenosis. Negative for aneurysm.  Posterior circulation: Small vertebral arteries in the setting of bilateral fetal PCA. No superimposed stenosis or branch occlusion. No asymmetric opacification of the venous structures to suggest dural fistula.  Venous sinuses: Patent. Unremarkable left sigmoid transverse dural venous sinuses with no evidence of dehiscence or stenosis.  Anatomic variants: Fetal type bilateral PCA.  Delayed phase: Negative for mass or other parenchymal enhancement.  IMPRESSION: 1. No arterial or venous explanation for left ear symptoms. 2. Atherosclerosis without flow limiting stenosis.  Review of Systems: Patient complains of symptoms per HPI as well as the following symptoms: no CP, no SOB. Pertinent negatives per HPI. All others negative.   Social History   Social History  . Marital status: Married    Spouse name: Mortimer Fries  . Number of children: 2  . Years of  education: 12   Occupational History  . Retired     Social History Main Topics  . Smoking status: Former  Smoker    Quit date: 03/01/1987  . Smokeless tobacco: Never Used  . Alcohol use No  . Drug use: No  . Sexual activity: Not on file   Other Topics Concern  . Not on file   Social History Narrative   Lives with husband Mortimer Fries   Caffeine use: Drinks coffee daily    Family History  Problem Relation Age of Onset  . Stroke Mother   . Dementia Neg Hx     Past Medical History:  Diagnosis Date  . High cholesterol   . Hypertension     Past Surgical History:  Procedure Laterality Date  . BACK SURGERY  1981  . REPLACEMENT TOTAL KNEE  2008    Current Outpatient Prescriptions  Medication Sig Dispense Refill  . Ascorbic Acid (VITAMIN C PO) Take 500 mg by mouth daily.    Marland Kitchen aspirin 81 MG tablet Take 81 mg by mouth daily.    . Cholecalciferol (VITAMIN D3 PO) Take 2,000 Units by mouth daily.    Marland Kitchen levothyroxine (SYNTHROID, LEVOTHROID) 100 MCG tablet Take 100 mcg by mouth daily.    . Multiple Vitamins-Minerals (MULTIVITAMIN ADULT PO) Take 1 tablet by mouth daily.    . Omega-3 Fatty Acids (FISH OIL PO) Take 1 Dose by mouth daily.     No current facility-administered medications for this visit.     Allergies as of 11/12/2015  . (No Known Allergies)    Vitals: BP (!) 154/74 (BP Location: Right Arm, Patient Position: Sitting, Cuff Size: Normal)   Pulse 64   Ht 5\' 7"  (1.702 m)   Wt 169 lb 6.4 oz (76.8 kg)   BMI 26.53 kg/m  Last Weight:  Wt Readings from Last 1 Encounters:  11/12/15 169 lb 6.4 oz (76.8 kg)   Last Height:   Ht Readings from Last 1 Encounters:  11/12/15 5\' 7"  (1.702 m)   Physical exam: Exam: Gen: NAD, conversant                    CV: RRR, no MRG. No Carotid Bruits. No peripheral edema, warm, nontender Eyes: Conjunctivae clear without exudates or hemorrhage  Neuro: Detailed Neurologic Exam  Speech:    Speech is normal; fluent and spontaneous with normal comprehension.  Cognition:    The patient is oriented to person, place, and time;     recent and remote  memory intact;     language fluent;     normal attention, concentration,     fund of knowledge Cranial Nerves:    The pupils are equal, round, and reactive to light. Attempted fundoscopic exam could not visualize due to small pupils Visual fields are full to finger confrontation. Extraocular movements are intact. Trigeminal sensation is intact and the muscles of mastication are normal. The face is symmetric. The palate elevates in the midline. Hearing intact. Voice is normal. Shoulder shrug is normal. The tongue has normal motion without fasciculations.   Coordination:    Normal finger to nose and heel to shin.   Gait:    Heel-toe and tandem gait are normal.   Motor Observation:    No asymmetry, no atrophy, and no involuntary movements noted. Tone:    Normal muscle tone.    Posture:    Posture is normal. normal erect    Strength:    Strength is V/V in the upper  and lower limbs.      Sensation: intact to LT     Reflex Exam:  DTR's: right abject AJ     Deep tendon reflexes in the upper and lower extremities are normal bilaterally.   Toes:    The toes are downgoing bilaterally.   Clonus:    Clonus is absent.       Assessment/Plan:  70 year old with new onset headaches after the age of 37, pulsatile tinnitus and hearing changes in the left ear, memory loss, word-finding difficulty. CTA of the head and neck done urgently were negative for aneurysm or vascular causes. Will order MRI of the brain w/wo contrast. Order labs today for eval of temporal arteritis and to check renal function and electrolytes.   Sarina Ill, MD  [facility] [address] [place], [place] [address]  Phone [phone] Fax [phone]

## 2015-11-12 NOTE — Telephone Encounter (Signed)
Dr Jaynee Eagles- FYI  Called [facility] and schedule CT head/neck  For tomorrow at 1210 check in for 1230 appt. Called and advised patient of appt time. Do not eat anything 4 hour prior to test. [employer]. She verbalized understanding.    Arina called from [facility] and stated pt already scheduled at [facility]. Darel Hong who scheduled that was not aware she had appt tomorrow already. She is calling to cancel that. She verbalized understanding.

## 2015-11-13 ENCOUNTER — Ambulatory Visit
Admission: RE | Admit: 2015-11-13 | Discharge: 2015-11-13 | Disposition: A | Discharge status: Home / Self Care | Payer: Medicare Other | Source: Ambulatory Visit | Attending: Neurology | Admitting: Neurology

## 2015-11-13 ENCOUNTER — Telehealth: Payer: Self-pay | Admitting: Neurology

## 2015-11-13 ENCOUNTER — Encounter: Payer: Self-pay | Admitting: Neurology

## 2015-11-13 DIAGNOSIS — R519 Headache, unspecified: Secondary | ICD-10-CM

## 2015-11-13 DIAGNOSIS — H93A9 Pulsatile tinnitus, unspecified ear: Secondary | ICD-10-CM

## 2015-11-13 DIAGNOSIS — R51 Headache: Principal | ICD-10-CM

## 2015-11-13 DIAGNOSIS — G4700 Insomnia, unspecified: Secondary | ICD-10-CM | POA: Diagnosis not present

## 2015-11-13 DIAGNOSIS — I10 Essential (primary) hypertension: Secondary | ICD-10-CM | POA: Insufficient documentation

## 2015-11-13 DIAGNOSIS — I671 Cerebral aneurysm, nonruptured: Secondary | ICD-10-CM

## 2015-11-13 DIAGNOSIS — I720 Aneurysm of carotid artery: Secondary | ICD-10-CM

## 2015-11-13 LAB — BASIC METABOLIC PANEL
BUN/Creatinine Ratio: 15 (ref 12–28)
BUN: 16 mg/dL (ref 8–27)
CALCIUM: 10.3 mg/dL (ref 8.7–10.3)
CO2: 30 mmol/L — ABNORMAL HIGH (ref 18–29)
CREATININE: 1.08 mg/dL — AB (ref 0.57–1.00)
Chloride: 101 mmol/L (ref 96–106)
GFR, EST AFRICAN AMERICAN: 60 mL/min/{1.73_m2} (ref >59)
GFR, EST NON AFRICAN AMERICAN: 52 mL/min/{1.73_m2} — AB (ref >59)
Glucose: 107 mg/dL — ABNORMAL HIGH (ref 65–99)
POTASSIUM: 5.3 mmol/L — AB (ref 3.5–5.2)
Sodium: 144 mmol/L (ref 134–144)

## 2015-11-13 LAB — SEDIMENTATION RATE: SED RATE: 4 mm/h (ref 0–40)

## 2015-11-13 LAB — C-REACTIVE PROTEIN: CRP: 5.1 mg/L — ABNORMAL HIGH (ref 0.0–4.9)

## 2015-11-13 MED ORDER — IOPAMIDOL (ISOVUE-370) INJECTION 76%
80.0000 mL | Freq: Once | INTRAVENOUS | Status: AC | PRN
  Administered 2015-11-13: 80 mL via INTRAVENOUS

## 2015-11-13 NOTE — Telephone Encounter (Signed)
Called patient, left message everything is fine with CTA of the head and neck.  Will order MRi brain next.   IMPRESSION: 1. No arterial or venous explanation for left ear symptoms. 2. Atherosclerosis without flow limiting stenosis.

## 2015-11-16 ENCOUNTER — Telehealth: Payer: Self-pay | Admitting: *Deleted

## 2015-11-16 NOTE — Telephone Encounter (Signed)
Called and spoke to patient about labs results per Dr Jaynee Eagles. She verbalized understanding. She stated she received Dr Cathren Laine message and tried calling back but we were closed. I relayed again CT head and neck looked fine per Dr Jaynee Eagles. She ordered MRI brain. Gave her [facility] number [phone] to call and schedule. She verbalized understanding.

## 2015-11-16 NOTE — Telephone Encounter (Signed)
-----   Message from Melvenia Beam, MD sent at 11/13/2015 11:54 AM EDT ----- Labs are unremarkable. Her creatinine is a little elevated but nothing concerning she should just follow up with primary care about this at next appoitnment within 6 monthds thanks

## 2015-11-19 DIAGNOSIS — Z23 Encounter for immunization: Secondary | ICD-10-CM | POA: Diagnosis not present

## 2015-11-25 DIAGNOSIS — H903 Sensorineural hearing loss, bilateral: Secondary | ICD-10-CM | POA: Diagnosis not present

## 2015-11-25 DIAGNOSIS — H9313 Tinnitus, bilateral: Secondary | ICD-10-CM | POA: Diagnosis not present

## 2015-11-30 ENCOUNTER — Ambulatory Visit
Admission: RE | Admit: 2015-11-30 | Discharge: 2015-11-30 | Disposition: A | Discharge status: Home / Self Care | Payer: Medicare Other | Source: Ambulatory Visit | Attending: Neurology | Admitting: Neurology

## 2015-11-30 DIAGNOSIS — R42 Dizziness and giddiness: Secondary | ICD-10-CM

## 2015-11-30 DIAGNOSIS — H93A9 Pulsatile tinnitus, unspecified ear: Secondary | ICD-10-CM | POA: Diagnosis not present

## 2015-11-30 DIAGNOSIS — R413 Other amnesia: Secondary | ICD-10-CM

## 2015-11-30 DIAGNOSIS — H9190 Unspecified hearing loss, unspecified ear: Secondary | ICD-10-CM

## 2015-11-30 DIAGNOSIS — R4789 Other speech disturbances: Secondary | ICD-10-CM

## 2015-11-30 DIAGNOSIS — R51 Headache: Principal | ICD-10-CM

## 2015-11-30 DIAGNOSIS — R519 Headache, unspecified: Secondary | ICD-10-CM

## 2015-11-30 MED ORDER — GADOBENATE DIMEGLUMINE 529 MG/ML IV SOLN
15.0000 mL | Freq: Once | INTRAVENOUS | Status: AC | PRN
  Administered 2015-11-30: 15 mL via INTRAVENOUS

## 2015-12-01 ENCOUNTER — Telehealth: Payer: Self-pay | Admitting: *Deleted

## 2015-12-01 NOTE — Telephone Encounter (Signed)
Called and spoke to pt about normal MRI brain per Dr Jaynee Eagles. Pt verbalized understanding and has no further questions at this time.

## 2015-12-01 NOTE — Telephone Encounter (Signed)
-----   Message from Melvenia Beam, MD sent at 11/30/2015  6:51 PM EDT ----- MRI of the brain is normal thanks

## 2015-12-15 DIAGNOSIS — I10 Essential (primary) hypertension: Secondary | ICD-10-CM | POA: Diagnosis not present

## 2015-12-15 DIAGNOSIS — E559 Vitamin D deficiency, unspecified: Secondary | ICD-10-CM | POA: Diagnosis not present

## 2015-12-15 DIAGNOSIS — Z1389 Encounter for screening for other disorder: Secondary | ICD-10-CM | POA: Diagnosis not present

## 2015-12-15 DIAGNOSIS — M810 Age-related osteoporosis without current pathological fracture: Secondary | ICD-10-CM | POA: Diagnosis not present

## 2015-12-15 DIAGNOSIS — E7800 Pure hypercholesterolemia, unspecified: Secondary | ICD-10-CM | POA: Diagnosis not present

## 2015-12-15 DIAGNOSIS — E039 Hypothyroidism, unspecified: Secondary | ICD-10-CM | POA: Diagnosis not present

## 2015-12-15 DIAGNOSIS — R0989 Other specified symptoms and signs involving the circulatory and respiratory systems: Secondary | ICD-10-CM | POA: Diagnosis not present

## 2015-12-15 DIAGNOSIS — Z8679 Personal history of other diseases of the circulatory system: Secondary | ICD-10-CM | POA: Diagnosis not present

## 2015-12-15 DIAGNOSIS — Z0000 Encounter for general adult medical examination without abnormal findings: Secondary | ICD-10-CM | POA: Diagnosis not present

## 2016-05-20 ENCOUNTER — Other Ambulatory Visit: Payer: Self-pay | Admitting: Family Medicine

## 2016-05-20 DIAGNOSIS — Z1231 Encounter for screening mammogram for malignant neoplasm of breast: Secondary | ICD-10-CM

## 2016-05-23 ENCOUNTER — Ambulatory Visit
Admission: RE | Admit: 2016-05-23 | Discharge: 2016-05-23 | Disposition: A | Discharge status: Home / Self Care | Payer: Medicare Other | Source: Ambulatory Visit | Attending: Family Medicine | Admitting: Family Medicine

## 2016-05-23 DIAGNOSIS — Z1231 Encounter for screening mammogram for malignant neoplasm of breast: Secondary | ICD-10-CM | POA: Diagnosis not present

## 2016-10-18 DIAGNOSIS — L570 Actinic keratosis: Secondary | ICD-10-CM | POA: Diagnosis not present

## 2016-10-18 DIAGNOSIS — L821 Other seborrheic keratosis: Secondary | ICD-10-CM | POA: Diagnosis not present

## 2016-10-18 DIAGNOSIS — L814 Other melanin hyperpigmentation: Secondary | ICD-10-CM | POA: Diagnosis not present

## 2016-10-18 DIAGNOSIS — D225 Melanocytic nevi of trunk: Secondary | ICD-10-CM | POA: Diagnosis not present

## 2016-10-18 DIAGNOSIS — D1801 Hemangioma of skin and subcutaneous tissue: Secondary | ICD-10-CM | POA: Diagnosis not present

## 2016-10-18 DIAGNOSIS — Z85828 Personal history of other malignant neoplasm of skin: Secondary | ICD-10-CM | POA: Diagnosis not present

## 2016-11-23 DIAGNOSIS — Z23 Encounter for immunization: Secondary | ICD-10-CM | POA: Diagnosis not present

## 2016-12-22 DIAGNOSIS — E039 Hypothyroidism, unspecified: Secondary | ICD-10-CM | POA: Diagnosis not present

## 2016-12-22 DIAGNOSIS — E7800 Pure hypercholesterolemia, unspecified: Secondary | ICD-10-CM | POA: Diagnosis not present

## 2016-12-22 DIAGNOSIS — Z1389 Encounter for screening for other disorder: Secondary | ICD-10-CM | POA: Diagnosis not present

## 2016-12-22 DIAGNOSIS — M8588 Other specified disorders of bone density and structure, other site: Secondary | ICD-10-CM | POA: Diagnosis not present

## 2016-12-22 DIAGNOSIS — Z0000 Encounter for general adult medical examination without abnormal findings: Secondary | ICD-10-CM | POA: Diagnosis not present

## 2016-12-22 DIAGNOSIS — Z8679 Personal history of other diseases of the circulatory system: Secondary | ICD-10-CM | POA: Diagnosis not present

## 2016-12-22 DIAGNOSIS — E559 Vitamin D deficiency, unspecified: Secondary | ICD-10-CM | POA: Diagnosis not present

## 2017-03-07 DIAGNOSIS — M8588 Other specified disorders of bone density and structure, other site: Secondary | ICD-10-CM | POA: Diagnosis not present

## 2017-04-13 DIAGNOSIS — M8588 Other specified disorders of bone density and structure, other site: Secondary | ICD-10-CM | POA: Diagnosis not present

## 2017-04-13 DIAGNOSIS — R937 Abnormal findings on diagnostic imaging of other parts of musculoskeletal system: Secondary | ICD-10-CM | POA: Diagnosis not present

## 2017-06-22 DIAGNOSIS — Z8679 Personal history of other diseases of the circulatory system: Secondary | ICD-10-CM | POA: Diagnosis not present

## 2017-06-22 DIAGNOSIS — E559 Vitamin D deficiency, unspecified: Secondary | ICD-10-CM | POA: Diagnosis not present

## 2017-06-22 DIAGNOSIS — E039 Hypothyroidism, unspecified: Secondary | ICD-10-CM | POA: Diagnosis not present

## 2017-06-22 DIAGNOSIS — R0609 Other forms of dyspnea: Secondary | ICD-10-CM | POA: Diagnosis not present

## 2017-06-22 DIAGNOSIS — E7800 Pure hypercholesterolemia, unspecified: Secondary | ICD-10-CM | POA: Diagnosis not present

## 2017-06-29 ENCOUNTER — Other Ambulatory Visit: Payer: Self-pay | Admitting: Family Medicine

## 2017-06-29 DIAGNOSIS — Z1231 Encounter for screening mammogram for malignant neoplasm of breast: Secondary | ICD-10-CM

## 2017-07-18 DIAGNOSIS — H25813 Combined forms of age-related cataract, bilateral: Secondary | ICD-10-CM | POA: Diagnosis not present

## 2017-07-18 DIAGNOSIS — H527 Unspecified disorder of refraction: Secondary | ICD-10-CM | POA: Diagnosis not present

## 2017-07-19 ENCOUNTER — Ambulatory Visit
Admission: RE | Admit: 2017-07-19 | Discharge: 2017-07-19 | Disposition: A | Discharge status: Home / Self Care | Payer: Medicare Other | Source: Ambulatory Visit | Attending: Family Medicine | Admitting: Family Medicine

## 2017-07-19 DIAGNOSIS — Z1231 Encounter for screening mammogram for malignant neoplasm of breast: Secondary | ICD-10-CM | POA: Diagnosis not present

## 2017-10-16 DIAGNOSIS — L570 Actinic keratosis: Secondary | ICD-10-CM | POA: Diagnosis not present

## 2017-10-16 DIAGNOSIS — Z85828 Personal history of other malignant neoplasm of skin: Secondary | ICD-10-CM | POA: Diagnosis not present

## 2017-10-16 DIAGNOSIS — D225 Melanocytic nevi of trunk: Secondary | ICD-10-CM | POA: Diagnosis not present

## 2017-10-16 DIAGNOSIS — L821 Other seborrheic keratosis: Secondary | ICD-10-CM | POA: Diagnosis not present

## 2017-10-16 DIAGNOSIS — D1801 Hemangioma of skin and subcutaneous tissue: Secondary | ICD-10-CM | POA: Diagnosis not present

## 2017-10-16 DIAGNOSIS — L814 Other melanin hyperpigmentation: Secondary | ICD-10-CM | POA: Diagnosis not present

## 2017-11-27 DIAGNOSIS — Z23 Encounter for immunization: Secondary | ICD-10-CM | POA: Diagnosis not present

## 2018-02-13 DIAGNOSIS — E039 Hypothyroidism, unspecified: Secondary | ICD-10-CM | POA: Diagnosis not present

## 2018-02-13 DIAGNOSIS — E7800 Pure hypercholesterolemia, unspecified: Secondary | ICD-10-CM | POA: Diagnosis not present

## 2018-02-13 DIAGNOSIS — Z0000 Encounter for general adult medical examination without abnormal findings: Secondary | ICD-10-CM | POA: Diagnosis not present

## 2018-02-13 DIAGNOSIS — E559 Vitamin D deficiency, unspecified: Secondary | ICD-10-CM | POA: Diagnosis not present

## 2018-02-13 DIAGNOSIS — M8588 Other specified disorders of bone density and structure, other site: Secondary | ICD-10-CM | POA: Diagnosis not present

## 2018-02-13 DIAGNOSIS — Z1389 Encounter for screening for other disorder: Secondary | ICD-10-CM | POA: Diagnosis not present

## 2018-02-13 DIAGNOSIS — Z1159 Encounter for screening for other viral diseases: Secondary | ICD-10-CM | POA: Diagnosis not present

## 2018-10-17 DIAGNOSIS — L821 Other seborrheic keratosis: Secondary | ICD-10-CM | POA: Diagnosis not present

## 2018-10-17 DIAGNOSIS — L720 Epidermal cyst: Secondary | ICD-10-CM | POA: Diagnosis not present

## 2018-10-17 DIAGNOSIS — Z85828 Personal history of other malignant neoplasm of skin: Secondary | ICD-10-CM | POA: Diagnosis not present

## 2018-10-17 DIAGNOSIS — L814 Other melanin hyperpigmentation: Secondary | ICD-10-CM | POA: Diagnosis not present

## 2018-10-17 DIAGNOSIS — L570 Actinic keratosis: Secondary | ICD-10-CM | POA: Diagnosis not present

## 2018-10-17 DIAGNOSIS — D225 Melanocytic nevi of trunk: Secondary | ICD-10-CM | POA: Diagnosis not present

## 2018-10-18 DIAGNOSIS — Z23 Encounter for immunization: Secondary | ICD-10-CM | POA: Diagnosis not present

## 2019-02-20 ENCOUNTER — Other Ambulatory Visit: Payer: Self-pay | Admitting: Family Medicine

## 2019-02-20 DIAGNOSIS — Z1231 Encounter for screening mammogram for malignant neoplasm of breast: Secondary | ICD-10-CM

## 2019-03-05 DIAGNOSIS — E039 Hypothyroidism, unspecified: Secondary | ICD-10-CM | POA: Diagnosis not present

## 2019-03-05 DIAGNOSIS — E7800 Pure hypercholesterolemia, unspecified: Secondary | ICD-10-CM | POA: Diagnosis not present

## 2019-03-05 DIAGNOSIS — Z0000 Encounter for general adult medical examination without abnormal findings: Secondary | ICD-10-CM | POA: Diagnosis not present

## 2019-03-05 DIAGNOSIS — E559 Vitamin D deficiency, unspecified: Secondary | ICD-10-CM | POA: Diagnosis not present

## 2019-03-05 DIAGNOSIS — M8588 Other specified disorders of bone density and structure, other site: Secondary | ICD-10-CM | POA: Diagnosis not present

## 2019-03-05 DIAGNOSIS — Z8679 Personal history of other diseases of the circulatory system: Secondary | ICD-10-CM | POA: Diagnosis not present

## 2019-03-08 ENCOUNTER — Other Ambulatory Visit: Payer: Self-pay

## 2019-03-08 ENCOUNTER — Ambulatory Visit
Admission: RE | Admit: 2019-03-08 | Discharge: 2019-03-08 | Disposition: A | Discharge status: Home / Self Care | Payer: Medicare Other | Source: Ambulatory Visit | Attending: Family Medicine | Admitting: Family Medicine

## 2019-03-08 ENCOUNTER — Other Ambulatory Visit: Payer: Self-pay | Admitting: Family Medicine

## 2019-03-08 DIAGNOSIS — M8580 Other specified disorders of bone density and structure, unspecified site: Secondary | ICD-10-CM

## 2019-03-08 DIAGNOSIS — Z1231 Encounter for screening mammogram for malignant neoplasm of breast: Secondary | ICD-10-CM

## 2019-03-12 ENCOUNTER — Ambulatory Visit
Admission: RE | Admit: 2019-03-12 | Discharge: 2019-03-12 | Disposition: A | Discharge status: Home / Self Care | Payer: Medicare Other | Source: Ambulatory Visit | Attending: Family Medicine | Admitting: Family Medicine

## 2019-03-12 ENCOUNTER — Other Ambulatory Visit: Payer: Self-pay

## 2019-03-12 DIAGNOSIS — M8580 Other specified disorders of bone density and structure, unspecified site: Secondary | ICD-10-CM

## 2019-03-12 DIAGNOSIS — M85852 Other specified disorders of bone density and structure, left thigh: Secondary | ICD-10-CM | POA: Diagnosis not present

## 2019-03-12 DIAGNOSIS — Z780 Asymptomatic menopausal state: Secondary | ICD-10-CM | POA: Diagnosis not present

## 2019-05-27 DIAGNOSIS — H25813 Combined forms of age-related cataract, bilateral: Secondary | ICD-10-CM | POA: Diagnosis not present

## 2019-05-27 DIAGNOSIS — H524 Presbyopia: Secondary | ICD-10-CM | POA: Diagnosis not present

## 2019-06-05 DIAGNOSIS — E7800 Pure hypercholesterolemia, unspecified: Secondary | ICD-10-CM | POA: Diagnosis not present

## 2019-10-23 DIAGNOSIS — L570 Actinic keratosis: Secondary | ICD-10-CM | POA: Diagnosis not present

## 2019-10-23 DIAGNOSIS — L814 Other melanin hyperpigmentation: Secondary | ICD-10-CM | POA: Diagnosis not present

## 2019-10-23 DIAGNOSIS — D225 Melanocytic nevi of trunk: Secondary | ICD-10-CM | POA: Diagnosis not present

## 2019-10-23 DIAGNOSIS — L821 Other seborrheic keratosis: Secondary | ICD-10-CM | POA: Diagnosis not present

## 2019-10-23 DIAGNOSIS — Z85828 Personal history of other malignant neoplasm of skin: Secondary | ICD-10-CM | POA: Diagnosis not present

## 2019-11-26 DIAGNOSIS — Z23 Encounter for immunization: Secondary | ICD-10-CM | POA: Diagnosis not present

## 2019-12-31 DIAGNOSIS — Z23 Encounter for immunization: Secondary | ICD-10-CM | POA: Diagnosis not present

## 2020-03-02 DIAGNOSIS — E7800 Pure hypercholesterolemia, unspecified: Secondary | ICD-10-CM | POA: Diagnosis not present

## 2020-03-02 DIAGNOSIS — E559 Vitamin D deficiency, unspecified: Secondary | ICD-10-CM | POA: Diagnosis not present

## 2020-03-02 DIAGNOSIS — Z8679 Personal history of other diseases of the circulatory system: Secondary | ICD-10-CM | POA: Diagnosis not present

## 2020-03-02 DIAGNOSIS — E039 Hypothyroidism, unspecified: Secondary | ICD-10-CM | POA: Diagnosis not present

## 2020-03-06 DIAGNOSIS — Z1389 Encounter for screening for other disorder: Secondary | ICD-10-CM | POA: Diagnosis not present

## 2020-03-06 DIAGNOSIS — Z0000 Encounter for general adult medical examination without abnormal findings: Secondary | ICD-10-CM | POA: Diagnosis not present

## 2020-03-12 DIAGNOSIS — Z1152 Encounter for screening for COVID-19: Secondary | ICD-10-CM | POA: Diagnosis not present

## 2020-04-08 ENCOUNTER — Other Ambulatory Visit: Payer: Self-pay | Admitting: Family Medicine

## 2020-04-08 DIAGNOSIS — Z1231 Encounter for screening mammogram for malignant neoplasm of breast: Secondary | ICD-10-CM

## 2020-04-09 ENCOUNTER — Ambulatory Visit
Admission: RE | Admit: 2020-04-09 | Discharge: 2020-04-09 | Disposition: A | Discharge status: Home / Self Care | Payer: Medicare Other | Source: Ambulatory Visit | Attending: Family Medicine | Admitting: Family Medicine

## 2020-04-09 ENCOUNTER — Other Ambulatory Visit: Payer: Self-pay

## 2020-04-09 DIAGNOSIS — Z1231 Encounter for screening mammogram for malignant neoplasm of breast: Secondary | ICD-10-CM | POA: Diagnosis not present

## 2020-05-05 DIAGNOSIS — K219 Gastro-esophageal reflux disease without esophagitis: Secondary | ICD-10-CM | POA: Diagnosis not present

## 2020-05-05 DIAGNOSIS — Z86010 Personal history of colonic polyps: Secondary | ICD-10-CM | POA: Diagnosis not present

## 2020-05-05 DIAGNOSIS — K5901 Slow transit constipation: Secondary | ICD-10-CM | POA: Diagnosis not present

## 2020-06-04 DIAGNOSIS — D124 Benign neoplasm of descending colon: Secondary | ICD-10-CM | POA: Diagnosis not present

## 2020-06-04 DIAGNOSIS — Z86010 Personal history of colonic polyps: Secondary | ICD-10-CM | POA: Diagnosis not present

## 2020-06-04 DIAGNOSIS — Z800 Family history of malignant neoplasm of digestive organs: Secondary | ICD-10-CM | POA: Diagnosis not present

## 2020-06-04 DIAGNOSIS — K5730 Diverticulosis of large intestine without perforation or abscess without bleeding: Secondary | ICD-10-CM | POA: Diagnosis not present

## 2020-06-04 DIAGNOSIS — Z8371 Family history of colonic polyps: Secondary | ICD-10-CM | POA: Diagnosis not present

## 2020-06-04 DIAGNOSIS — D122 Benign neoplasm of ascending colon: Secondary | ICD-10-CM | POA: Diagnosis not present

## 2020-06-09 DIAGNOSIS — D124 Benign neoplasm of descending colon: Secondary | ICD-10-CM | POA: Diagnosis not present

## 2020-06-09 DIAGNOSIS — D122 Benign neoplasm of ascending colon: Secondary | ICD-10-CM | POA: Diagnosis not present

## 2020-07-22 DIAGNOSIS — H25813 Combined forms of age-related cataract, bilateral: Secondary | ICD-10-CM | POA: Diagnosis not present

## 2020-07-22 DIAGNOSIS — H524 Presbyopia: Secondary | ICD-10-CM | POA: Diagnosis not present

## 2020-12-15 DIAGNOSIS — Z23 Encounter for immunization: Secondary | ICD-10-CM | POA: Diagnosis not present

## 2021-03-09 DIAGNOSIS — Z23 Encounter for immunization: Secondary | ICD-10-CM | POA: Diagnosis not present

## 2021-03-16 DIAGNOSIS — E7800 Pure hypercholesterolemia, unspecified: Secondary | ICD-10-CM | POA: Diagnosis not present

## 2021-03-16 DIAGNOSIS — N1831 Chronic kidney disease, stage 3a: Secondary | ICD-10-CM | POA: Diagnosis not present

## 2021-03-16 DIAGNOSIS — Z0000 Encounter for general adult medical examination without abnormal findings: Secondary | ICD-10-CM | POA: Diagnosis not present

## 2021-03-16 DIAGNOSIS — E559 Vitamin D deficiency, unspecified: Secondary | ICD-10-CM | POA: Diagnosis not present

## 2021-03-16 DIAGNOSIS — E039 Hypothyroidism, unspecified: Secondary | ICD-10-CM | POA: Diagnosis not present

## 2021-03-16 DIAGNOSIS — R944 Abnormal results of kidney function studies: Secondary | ICD-10-CM | POA: Diagnosis not present

## 2021-03-16 DIAGNOSIS — Z8679 Personal history of other diseases of the circulatory system: Secondary | ICD-10-CM | POA: Diagnosis not present

## 2021-04-05 ENCOUNTER — Other Ambulatory Visit: Payer: Self-pay | Admitting: Family Medicine

## 2021-04-05 DIAGNOSIS — Z1231 Encounter for screening mammogram for malignant neoplasm of breast: Secondary | ICD-10-CM

## 2021-04-21 DIAGNOSIS — L821 Other seborrheic keratosis: Secondary | ICD-10-CM | POA: Diagnosis not present

## 2021-04-21 DIAGNOSIS — L570 Actinic keratosis: Secondary | ICD-10-CM | POA: Diagnosis not present

## 2021-04-21 DIAGNOSIS — D235 Other benign neoplasm of skin of trunk: Secondary | ICD-10-CM | POA: Diagnosis not present

## 2021-04-21 DIAGNOSIS — D225 Melanocytic nevi of trunk: Secondary | ICD-10-CM | POA: Diagnosis not present

## 2021-04-21 DIAGNOSIS — L579 Skin changes due to chronic exposure to nonionizing radiation, unspecified: Secondary | ICD-10-CM | POA: Diagnosis not present

## 2021-04-21 DIAGNOSIS — L814 Other melanin hyperpigmentation: Secondary | ICD-10-CM | POA: Diagnosis not present

## 2021-05-10 ENCOUNTER — Ambulatory Visit
Admission: RE | Admit: 2021-05-10 | Discharge: 2021-05-10 | Disposition: A | Discharge status: Home / Self Care | Payer: Medicare Other | Source: Ambulatory Visit | Attending: Family Medicine | Admitting: Family Medicine

## 2021-05-10 DIAGNOSIS — Z1231 Encounter for screening mammogram for malignant neoplasm of breast: Secondary | ICD-10-CM | POA: Diagnosis not present

## 2021-05-13 ENCOUNTER — Ambulatory Visit: Payer: Medicare Other

## 2021-06-02 DIAGNOSIS — L570 Actinic keratosis: Secondary | ICD-10-CM | POA: Diagnosis not present

## 2021-10-26 DIAGNOSIS — R300 Dysuria: Secondary | ICD-10-CM | POA: Diagnosis not present

## 2021-11-03 DIAGNOSIS — L570 Actinic keratosis: Secondary | ICD-10-CM | POA: Diagnosis not present

## 2021-11-03 DIAGNOSIS — L579 Skin changes due to chronic exposure to nonionizing radiation, unspecified: Secondary | ICD-10-CM | POA: Diagnosis not present

## 2021-11-03 DIAGNOSIS — L821 Other seborrheic keratosis: Secondary | ICD-10-CM | POA: Diagnosis not present

## 2021-11-03 DIAGNOSIS — L814 Other melanin hyperpigmentation: Secondary | ICD-10-CM | POA: Diagnosis not present

## 2021-11-03 DIAGNOSIS — L601 Onycholysis: Secondary | ICD-10-CM | POA: Diagnosis not present

## 2021-11-03 DIAGNOSIS — D235 Other benign neoplasm of skin of trunk: Secondary | ICD-10-CM | POA: Diagnosis not present

## 2021-12-27 DIAGNOSIS — Z23 Encounter for immunization: Secondary | ICD-10-CM | POA: Diagnosis not present

## 2022-02-15 DIAGNOSIS — Z23 Encounter for immunization: Secondary | ICD-10-CM | POA: Diagnosis not present

## 2022-03-17 DIAGNOSIS — E559 Vitamin D deficiency, unspecified: Secondary | ICD-10-CM | POA: Diagnosis not present

## 2022-03-17 DIAGNOSIS — E7800 Pure hypercholesterolemia, unspecified: Secondary | ICD-10-CM | POA: Diagnosis not present

## 2022-03-17 DIAGNOSIS — Z6826 Body mass index (BMI) 26.0-26.9, adult: Secondary | ICD-10-CM | POA: Diagnosis not present

## 2022-03-17 DIAGNOSIS — M7630 Iliotibial band syndrome, unspecified leg: Secondary | ICD-10-CM | POA: Diagnosis not present

## 2022-03-17 DIAGNOSIS — Z0000 Encounter for general adult medical examination without abnormal findings: Secondary | ICD-10-CM | POA: Diagnosis not present

## 2022-03-17 DIAGNOSIS — E039 Hypothyroidism, unspecified: Secondary | ICD-10-CM | POA: Diagnosis not present

## 2022-03-17 DIAGNOSIS — N1831 Chronic kidney disease, stage 3a: Secondary | ICD-10-CM | POA: Diagnosis not present

## 2022-04-20 DIAGNOSIS — L570 Actinic keratosis: Secondary | ICD-10-CM | POA: Diagnosis not present

## 2022-04-20 DIAGNOSIS — L814 Other melanin hyperpigmentation: Secondary | ICD-10-CM | POA: Diagnosis not present

## 2022-04-20 DIAGNOSIS — L579 Skin changes due to chronic exposure to nonionizing radiation, unspecified: Secondary | ICD-10-CM | POA: Diagnosis not present

## 2022-04-20 DIAGNOSIS — D235 Other benign neoplasm of skin of trunk: Secondary | ICD-10-CM | POA: Diagnosis not present

## 2022-04-20 DIAGNOSIS — L821 Other seborrheic keratosis: Secondary | ICD-10-CM | POA: Diagnosis not present

## 2022-05-16 ENCOUNTER — Other Ambulatory Visit: Payer: Self-pay | Admitting: Family Medicine

## 2022-05-16 DIAGNOSIS — Z1231 Encounter for screening mammogram for malignant neoplasm of breast: Secondary | ICD-10-CM

## 2022-05-18 ENCOUNTER — Ambulatory Visit
Admission: RE | Admit: 2022-05-18 | Discharge: 2022-05-18 | Disposition: A | Discharge status: Home / Self Care | Payer: Medicare Other | Source: Ambulatory Visit | Attending: Family Medicine | Admitting: Family Medicine

## 2022-05-18 DIAGNOSIS — Z1231 Encounter for screening mammogram for malignant neoplasm of breast: Secondary | ICD-10-CM | POA: Diagnosis not present

## 2022-08-03 DIAGNOSIS — H25813 Combined forms of age-related cataract, bilateral: Secondary | ICD-10-CM | POA: Diagnosis not present

## 2022-08-03 DIAGNOSIS — H524 Presbyopia: Secondary | ICD-10-CM | POA: Diagnosis not present

## 2022-10-18 DIAGNOSIS — E559 Vitamin D deficiency, unspecified: Secondary | ICD-10-CM | POA: Diagnosis not present

## 2022-10-18 DIAGNOSIS — M2011 Hallux valgus (acquired), right foot: Secondary | ICD-10-CM | POA: Diagnosis not present

## 2022-10-18 DIAGNOSIS — M7989 Other specified soft tissue disorders: Secondary | ICD-10-CM | POA: Diagnosis not present

## 2022-10-18 DIAGNOSIS — M2012 Hallux valgus (acquired), left foot: Secondary | ICD-10-CM | POA: Diagnosis not present

## 2022-10-18 DIAGNOSIS — M2022 Hallux rigidus, left foot: Secondary | ICD-10-CM | POA: Diagnosis not present

## 2022-10-19 DIAGNOSIS — M2011 Hallux valgus (acquired), right foot: Secondary | ICD-10-CM | POA: Diagnosis not present

## 2022-10-19 DIAGNOSIS — M2022 Hallux rigidus, left foot: Secondary | ICD-10-CM | POA: Diagnosis not present

## 2022-10-27 DIAGNOSIS — M2022 Hallux rigidus, left foot: Secondary | ICD-10-CM | POA: Diagnosis not present

## 2022-11-08 DIAGNOSIS — Z9889 Other specified postprocedural states: Secondary | ICD-10-CM | POA: Diagnosis not present

## 2022-11-08 DIAGNOSIS — M810 Age-related osteoporosis without current pathological fracture: Secondary | ICD-10-CM | POA: Diagnosis not present

## 2022-11-08 DIAGNOSIS — Z79899 Other long term (current) drug therapy: Secondary | ICD-10-CM | POA: Diagnosis not present

## 2022-11-08 DIAGNOSIS — Z87891 Personal history of nicotine dependence: Secondary | ICD-10-CM | POA: Diagnosis not present

## 2022-11-08 DIAGNOSIS — G8918 Other acute postprocedural pain: Secondary | ICD-10-CM | POA: Diagnosis not present

## 2022-11-08 DIAGNOSIS — E039 Hypothyroidism, unspecified: Secondary | ICD-10-CM | POA: Diagnosis not present

## 2022-11-08 DIAGNOSIS — Z79890 Hormone replacement therapy (postmenopausal): Secondary | ICD-10-CM | POA: Diagnosis not present

## 2022-11-08 DIAGNOSIS — M2022 Hallux rigidus, left foot: Secondary | ICD-10-CM | POA: Diagnosis not present

## 2022-11-08 DIAGNOSIS — N183 Chronic kidney disease, stage 3 unspecified: Secondary | ICD-10-CM | POA: Diagnosis not present

## 2022-11-09 DIAGNOSIS — Z9889 Other specified postprocedural states: Secondary | ICD-10-CM | POA: Diagnosis not present

## 2022-11-16 DIAGNOSIS — Z4889 Encounter for other specified surgical aftercare: Secondary | ICD-10-CM | POA: Diagnosis not present

## 2022-11-30 DIAGNOSIS — Z9889 Other specified postprocedural states: Secondary | ICD-10-CM | POA: Diagnosis not present

## 2022-12-14 DIAGNOSIS — Z4889 Encounter for other specified surgical aftercare: Secondary | ICD-10-CM | POA: Diagnosis not present

## 2022-12-14 DIAGNOSIS — Z9889 Other specified postprocedural states: Secondary | ICD-10-CM | POA: Diagnosis not present

## 2022-12-28 DIAGNOSIS — Z23 Encounter for immunization: Secondary | ICD-10-CM | POA: Diagnosis not present

## 2023-01-25 DIAGNOSIS — Z9889 Other specified postprocedural states: Secondary | ICD-10-CM | POA: Diagnosis not present

## 2023-02-25 DIAGNOSIS — Z23 Encounter for immunization: Secondary | ICD-10-CM | POA: Diagnosis not present

## 2023-03-23 DIAGNOSIS — N1831 Chronic kidney disease, stage 3a: Secondary | ICD-10-CM | POA: Diagnosis not present

## 2023-03-23 DIAGNOSIS — E7800 Pure hypercholesterolemia, unspecified: Secondary | ICD-10-CM | POA: Diagnosis not present

## 2023-03-23 DIAGNOSIS — M8588 Other specified disorders of bone density and structure, other site: Secondary | ICD-10-CM | POA: Diagnosis not present

## 2023-03-23 DIAGNOSIS — E039 Hypothyroidism, unspecified: Secondary | ICD-10-CM | POA: Diagnosis not present

## 2023-03-23 DIAGNOSIS — Z0000 Encounter for general adult medical examination without abnormal findings: Secondary | ICD-10-CM | POA: Diagnosis not present

## 2023-03-23 DIAGNOSIS — M1712 Unilateral primary osteoarthritis, left knee: Secondary | ICD-10-CM | POA: Diagnosis not present

## 2023-03-23 DIAGNOSIS — E559 Vitamin D deficiency, unspecified: Secondary | ICD-10-CM | POA: Diagnosis not present

## 2023-03-23 IMAGING — MG MM DIGITAL SCREENING BILAT W/ TOMO AND CAD
8 series · 8 of 24 positions shown · non-contrast
Comparison: Previous exam(s).

CLINICAL DATA: Screening.

EXAM:
DIGITAL SCREENING BILATERAL MAMMOGRAM WITH TOMOSYNTHESIS AND CAD
TECHNIQUE: Bilateral screening digital craniocaudal and mediolateral oblique
mammograms were obtained. Bilateral screening digital breast
tomosynthesis was performed. The images were evaluated with
computer-aided detection.

[L CC synth-2D]
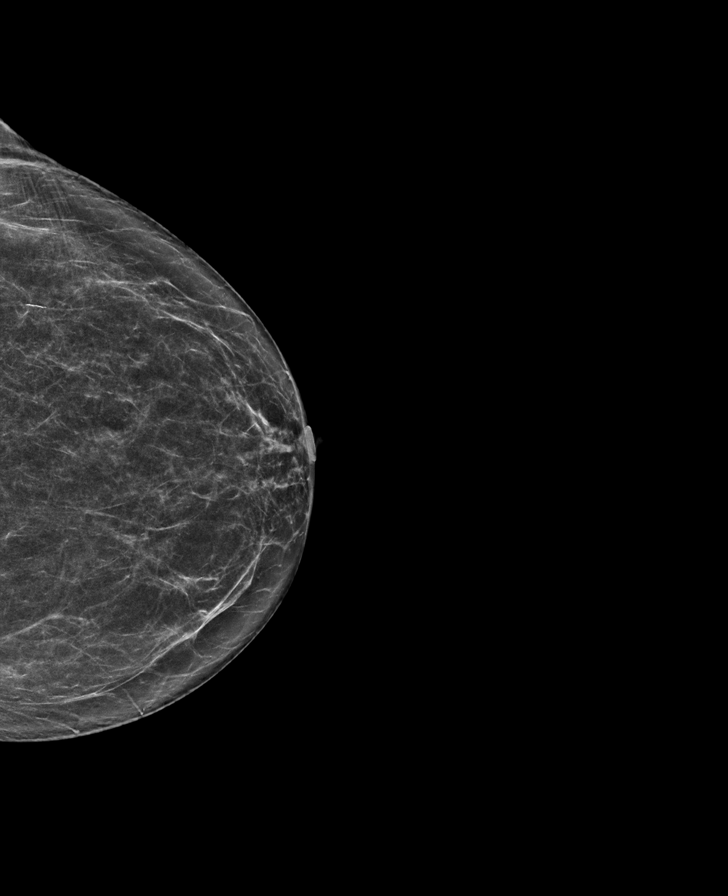

[R MLO synth-2D]
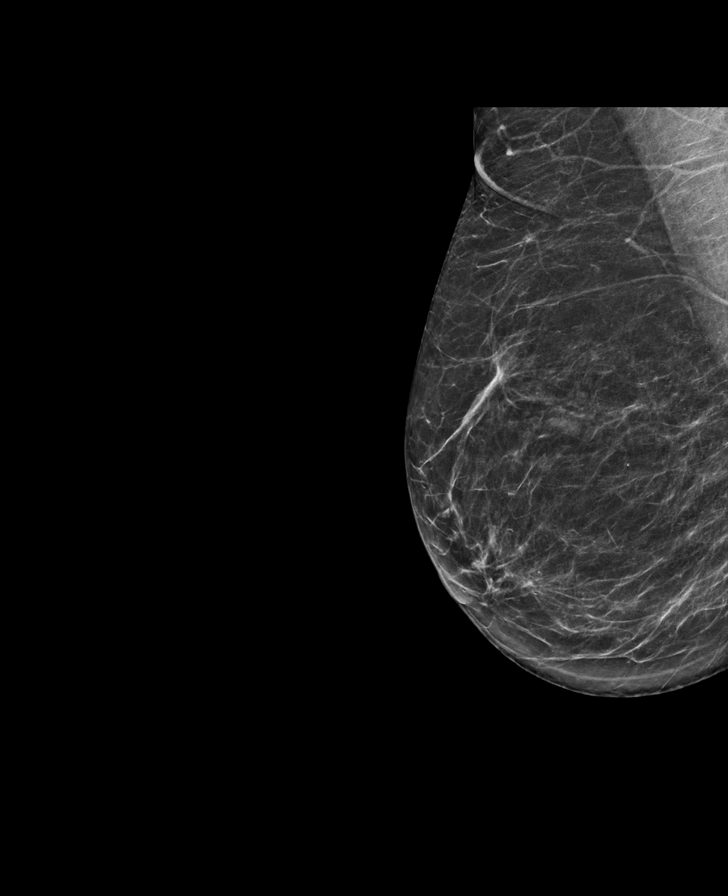

[L MLO synth-2D]
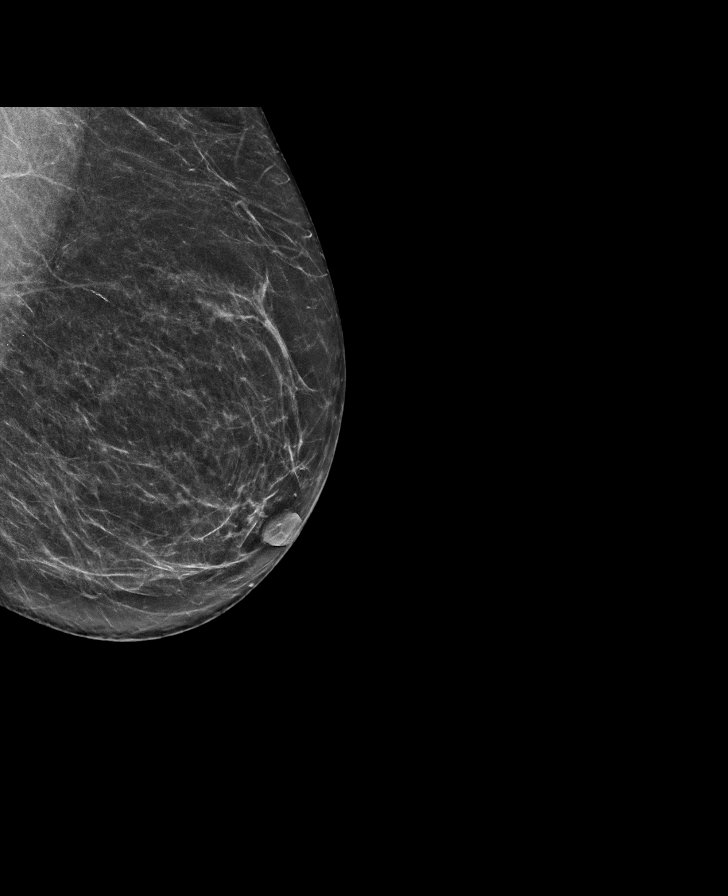

[R CC synth-2D]
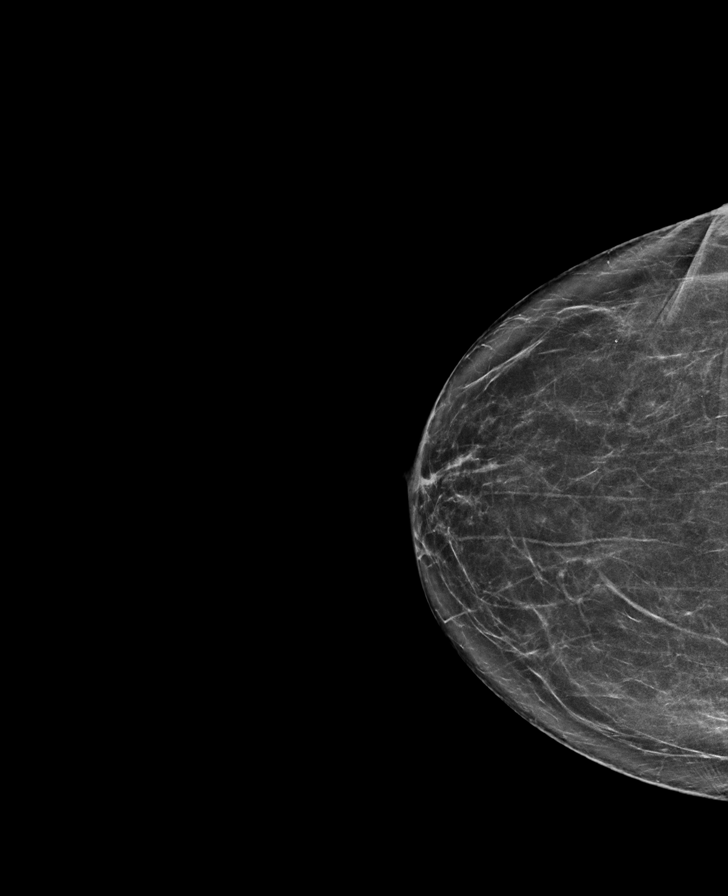

[L CC tomo · tomo slice 33/65.0]
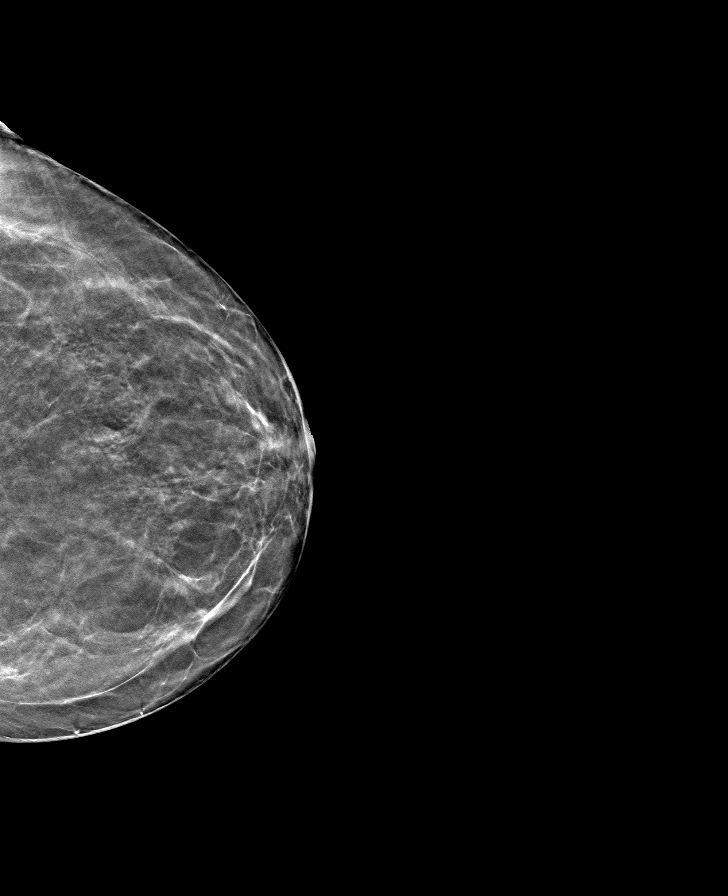

[R MLO tomo · tomo slice 33/66.0]
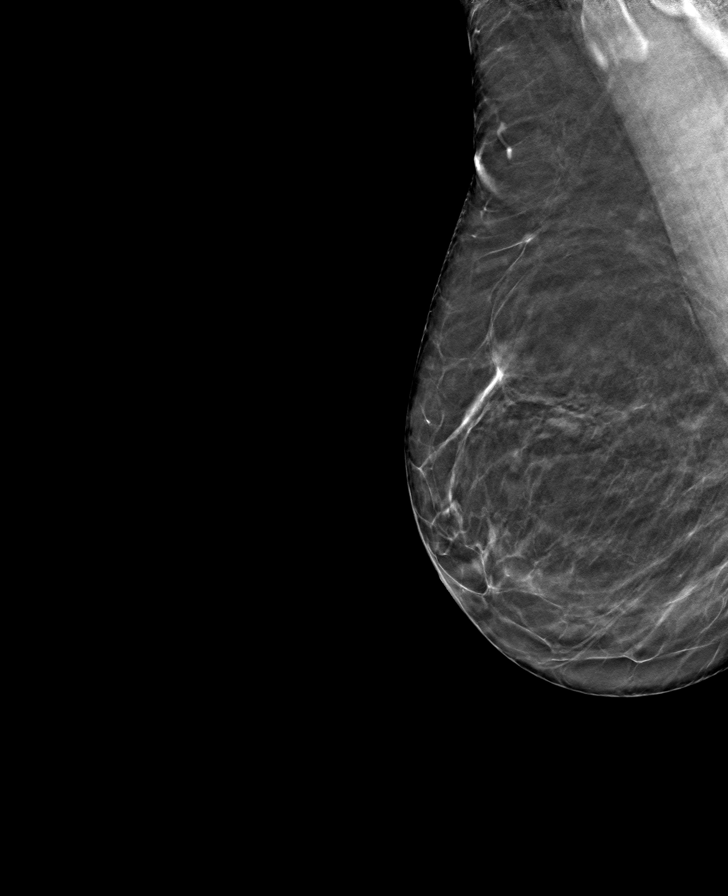

[R CC tomo · tomo slice 35/68.0]
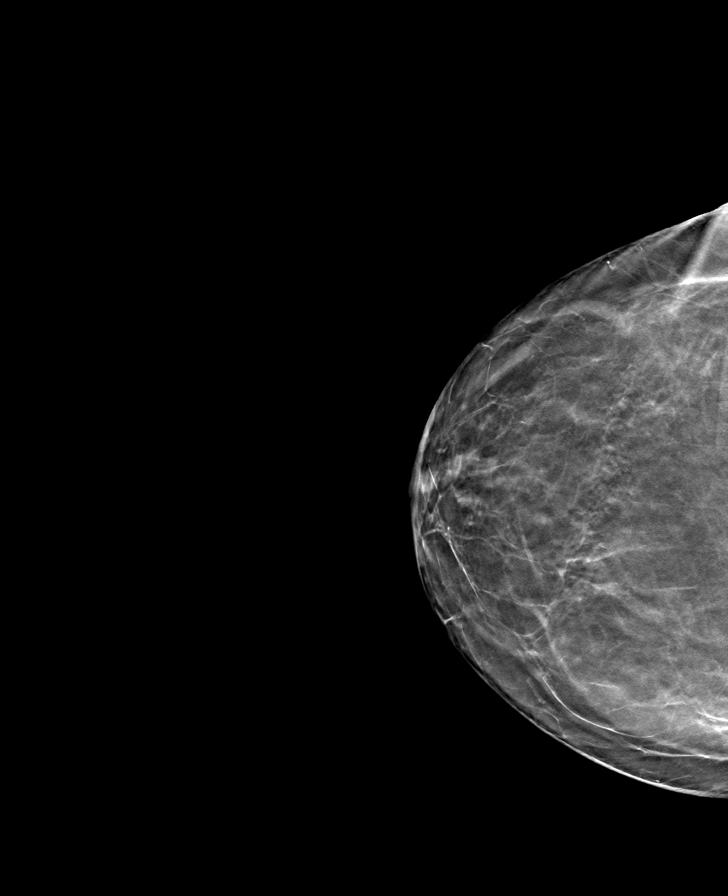

[L MLO tomo · tomo slice 33/66.0]
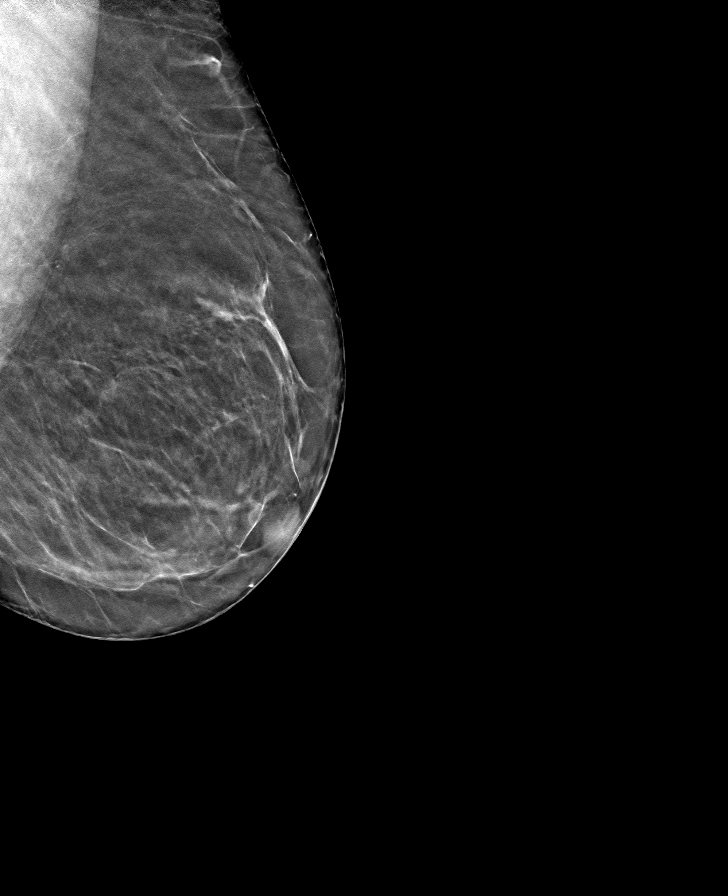

[8 of 24 positions shown; findings below may reference images not displayed]

ACR Breast Density Category b: There are scattered areas of
fibroglandular density.
FINDINGS: There are no findings suspicious for malignancy.
IMPRESSION: No mammographic evidence of malignancy. A result letter of this
screening mammogram will be mailed directly to the patient.

RECOMMENDATION:
Screening mammogram in one year. (Code:51-O-LD2)

BI-RADS CATEGORY  1: Negative.

## 2023-03-24 ENCOUNTER — Other Ambulatory Visit: Payer: Self-pay | Admitting: Family Medicine

## 2023-03-24 DIAGNOSIS — M8580 Other specified disorders of bone density and structure, unspecified site: Secondary | ICD-10-CM

## 2023-04-15 DIAGNOSIS — S36892A Contusion of other intra-abdominal organs, initial encounter: Secondary | ICD-10-CM | POA: Diagnosis not present

## 2023-04-15 DIAGNOSIS — S0990XA Unspecified injury of head, initial encounter: Secondary | ICD-10-CM | POA: Diagnosis not present

## 2023-04-15 DIAGNOSIS — S2243XA Multiple fractures of ribs, bilateral, initial encounter for closed fracture: Secondary | ICD-10-CM | POA: Diagnosis not present

## 2023-04-15 DIAGNOSIS — S3993XA Unspecified injury of pelvis, initial encounter: Secondary | ICD-10-CM | POA: Diagnosis not present

## 2023-04-15 DIAGNOSIS — S32111A Minimally displaced Zone I fracture of sacrum, initial encounter for closed fracture: Secondary | ICD-10-CM | POA: Diagnosis not present

## 2023-04-15 DIAGNOSIS — M79621 Pain in right upper arm: Secondary | ICD-10-CM | POA: Diagnosis not present

## 2023-04-15 DIAGNOSIS — R0789 Other chest pain: Secondary | ICD-10-CM | POA: Diagnosis not present

## 2023-04-15 DIAGNOSIS — S32030A Wedge compression fracture of third lumbar vertebra, initial encounter for closed fracture: Secondary | ICD-10-CM | POA: Diagnosis not present

## 2023-04-15 DIAGNOSIS — S32020A Wedge compression fracture of second lumbar vertebra, initial encounter for closed fracture: Secondary | ICD-10-CM | POA: Diagnosis not present

## 2023-04-15 DIAGNOSIS — M25511 Pain in right shoulder: Secondary | ICD-10-CM | POA: Diagnosis not present

## 2023-04-15 DIAGNOSIS — S3210XA Unspecified fracture of sacrum, initial encounter for closed fracture: Secondary | ICD-10-CM | POA: Diagnosis not present

## 2023-04-15 DIAGNOSIS — S32018A Other fracture of first lumbar vertebra, initial encounter for closed fracture: Secondary | ICD-10-CM | POA: Diagnosis not present

## 2023-04-15 DIAGNOSIS — S2222XA Fracture of body of sternum, initial encounter for closed fracture: Secondary | ICD-10-CM | POA: Diagnosis not present

## 2023-04-15 DIAGNOSIS — S32010A Wedge compression fracture of first lumbar vertebra, initial encounter for closed fracture: Secondary | ICD-10-CM | POA: Diagnosis not present

## 2023-04-15 DIAGNOSIS — M79662 Pain in left lower leg: Secondary | ICD-10-CM | POA: Diagnosis not present

## 2023-04-15 DIAGNOSIS — S199XXA Unspecified injury of neck, initial encounter: Secondary | ICD-10-CM | POA: Diagnosis not present

## 2023-04-15 DIAGNOSIS — S32038A Other fracture of third lumbar vertebra, initial encounter for closed fracture: Secondary | ICD-10-CM | POA: Diagnosis not present

## 2023-04-15 DIAGNOSIS — S7001XA Contusion of right hip, initial encounter: Secondary | ICD-10-CM | POA: Diagnosis not present

## 2023-04-15 DIAGNOSIS — M25551 Pain in right hip: Secondary | ICD-10-CM | POA: Diagnosis not present

## 2023-04-16 DIAGNOSIS — S301XXA Contusion of abdominal wall, initial encounter: Secondary | ICD-10-CM | POA: Diagnosis not present

## 2023-04-16 DIAGNOSIS — S32010A Wedge compression fracture of first lumbar vertebra, initial encounter for closed fracture: Secondary | ICD-10-CM | POA: Diagnosis not present

## 2023-04-16 DIAGNOSIS — S32119A Unspecified Zone I fracture of sacrum, initial encounter for closed fracture: Secondary | ICD-10-CM | POA: Diagnosis not present

## 2023-04-16 DIAGNOSIS — N183 Chronic kidney disease, stage 3 unspecified: Secondary | ICD-10-CM | POA: Diagnosis not present

## 2023-04-16 DIAGNOSIS — S32020A Wedge compression fracture of second lumbar vertebra, initial encounter for closed fracture: Secondary | ICD-10-CM | POA: Diagnosis not present

## 2023-04-16 DIAGNOSIS — S2222XA Fracture of body of sternum, initial encounter for closed fracture: Secondary | ICD-10-CM | POA: Diagnosis not present

## 2023-04-16 DIAGNOSIS — S2243XA Multiple fractures of ribs, bilateral, initial encounter for closed fracture: Secondary | ICD-10-CM | POA: Diagnosis not present

## 2023-04-16 DIAGNOSIS — S32030A Wedge compression fracture of third lumbar vertebra, initial encounter for closed fracture: Secondary | ICD-10-CM | POA: Diagnosis not present

## 2023-04-16 DIAGNOSIS — E039 Hypothyroidism, unspecified: Secondary | ICD-10-CM | POA: Diagnosis not present

## 2023-04-17 DIAGNOSIS — R7401 Elevation of levels of liver transaminase levels: Secondary | ICD-10-CM | POA: Diagnosis not present

## 2023-04-17 DIAGNOSIS — S2222XA Fracture of body of sternum, initial encounter for closed fracture: Secondary | ICD-10-CM | POA: Diagnosis not present

## 2023-04-17 DIAGNOSIS — N183 Chronic kidney disease, stage 3 unspecified: Secondary | ICD-10-CM | POA: Diagnosis not present

## 2023-04-17 DIAGNOSIS — S2243XA Multiple fractures of ribs, bilateral, initial encounter for closed fracture: Secondary | ICD-10-CM | POA: Diagnosis not present

## 2023-04-17 DIAGNOSIS — R339 Retention of urine, unspecified: Secondary | ICD-10-CM | POA: Diagnosis not present

## 2023-04-17 DIAGNOSIS — R52 Pain, unspecified: Secondary | ICD-10-CM | POA: Diagnosis not present

## 2023-04-17 DIAGNOSIS — S3210XA Unspecified fracture of sacrum, initial encounter for closed fracture: Secondary | ICD-10-CM | POA: Diagnosis not present

## 2023-04-17 DIAGNOSIS — S32030A Wedge compression fracture of third lumbar vertebra, initial encounter for closed fracture: Secondary | ICD-10-CM | POA: Diagnosis not present

## 2023-04-17 DIAGNOSIS — I517 Cardiomegaly: Secondary | ICD-10-CM | POA: Diagnosis not present

## 2023-04-17 DIAGNOSIS — S301XXA Contusion of abdominal wall, initial encounter: Secondary | ICD-10-CM | POA: Diagnosis not present

## 2023-04-17 DIAGNOSIS — S32020A Wedge compression fracture of second lumbar vertebra, initial encounter for closed fracture: Secondary | ICD-10-CM | POA: Diagnosis not present

## 2023-04-17 DIAGNOSIS — E039 Hypothyroidism, unspecified: Secondary | ICD-10-CM | POA: Diagnosis not present

## 2023-04-17 DIAGNOSIS — S32010A Wedge compression fracture of first lumbar vertebra, initial encounter for closed fracture: Secondary | ICD-10-CM | POA: Diagnosis not present

## 2023-04-18 DIAGNOSIS — S301XXA Contusion of abdominal wall, initial encounter: Secondary | ICD-10-CM | POA: Diagnosis not present

## 2023-04-18 DIAGNOSIS — S32030A Wedge compression fracture of third lumbar vertebra, initial encounter for closed fracture: Secondary | ICD-10-CM | POA: Diagnosis not present

## 2023-04-18 DIAGNOSIS — E039 Hypothyroidism, unspecified: Secondary | ICD-10-CM | POA: Diagnosis not present

## 2023-04-18 DIAGNOSIS — S2222XA Fracture of body of sternum, initial encounter for closed fracture: Secondary | ICD-10-CM | POA: Diagnosis not present

## 2023-04-18 DIAGNOSIS — S2243XA Multiple fractures of ribs, bilateral, initial encounter for closed fracture: Secondary | ICD-10-CM | POA: Diagnosis not present

## 2023-04-18 DIAGNOSIS — S32028A Other fracture of second lumbar vertebra, initial encounter for closed fracture: Secondary | ICD-10-CM | POA: Diagnosis not present

## 2023-04-18 DIAGNOSIS — S32010A Wedge compression fracture of first lumbar vertebra, initial encounter for closed fracture: Secondary | ICD-10-CM | POA: Diagnosis not present

## 2023-04-18 DIAGNOSIS — S32038A Other fracture of third lumbar vertebra, initial encounter for closed fracture: Secondary | ICD-10-CM | POA: Diagnosis not present

## 2023-04-18 DIAGNOSIS — S32018A Other fracture of first lumbar vertebra, initial encounter for closed fracture: Secondary | ICD-10-CM | POA: Diagnosis not present

## 2023-04-18 DIAGNOSIS — S32020A Wedge compression fracture of second lumbar vertebra, initial encounter for closed fracture: Secondary | ICD-10-CM | POA: Diagnosis not present

## 2023-04-18 DIAGNOSIS — S3210XA Unspecified fracture of sacrum, initial encounter for closed fracture: Secondary | ICD-10-CM | POA: Diagnosis not present

## 2023-04-18 DIAGNOSIS — N183 Chronic kidney disease, stage 3 unspecified: Secondary | ICD-10-CM | POA: Diagnosis not present

## 2023-04-19 DIAGNOSIS — S32030A Wedge compression fracture of third lumbar vertebra, initial encounter for closed fracture: Secondary | ICD-10-CM | POA: Diagnosis not present

## 2023-04-19 DIAGNOSIS — S2243XA Multiple fractures of ribs, bilateral, initial encounter for closed fracture: Secondary | ICD-10-CM | POA: Diagnosis not present

## 2023-04-19 DIAGNOSIS — E039 Hypothyroidism, unspecified: Secondary | ICD-10-CM | POA: Diagnosis not present

## 2023-04-19 DIAGNOSIS — S32020A Wedge compression fracture of second lumbar vertebra, initial encounter for closed fracture: Secondary | ICD-10-CM | POA: Diagnosis not present

## 2023-04-19 DIAGNOSIS — S32119A Unspecified Zone I fracture of sacrum, initial encounter for closed fracture: Secondary | ICD-10-CM | POA: Diagnosis not present

## 2023-04-19 DIAGNOSIS — R0689 Other abnormalities of breathing: Secondary | ICD-10-CM | POA: Diagnosis not present

## 2023-04-19 DIAGNOSIS — N183 Chronic kidney disease, stage 3 unspecified: Secondary | ICD-10-CM | POA: Diagnosis not present

## 2023-04-19 DIAGNOSIS — S36892A Contusion of other intra-abdominal organs, initial encounter: Secondary | ICD-10-CM | POA: Diagnosis not present

## 2023-04-19 DIAGNOSIS — D62 Acute posthemorrhagic anemia: Secondary | ICD-10-CM | POA: Diagnosis not present

## 2023-04-19 DIAGNOSIS — S2222XA Fracture of body of sternum, initial encounter for closed fracture: Secondary | ICD-10-CM | POA: Diagnosis not present

## 2023-04-19 DIAGNOSIS — S32010A Wedge compression fracture of first lumbar vertebra, initial encounter for closed fracture: Secondary | ICD-10-CM | POA: Diagnosis not present

## 2023-04-20 DIAGNOSIS — S32030A Wedge compression fracture of third lumbar vertebra, initial encounter for closed fracture: Secondary | ICD-10-CM | POA: Diagnosis not present

## 2023-04-20 DIAGNOSIS — D62 Acute posthemorrhagic anemia: Secondary | ICD-10-CM | POA: Diagnosis not present

## 2023-04-20 DIAGNOSIS — S32020A Wedge compression fracture of second lumbar vertebra, initial encounter for closed fracture: Secondary | ICD-10-CM | POA: Diagnosis not present

## 2023-04-20 DIAGNOSIS — N183 Chronic kidney disease, stage 3 unspecified: Secondary | ICD-10-CM | POA: Diagnosis not present

## 2023-04-20 DIAGNOSIS — S32010A Wedge compression fracture of first lumbar vertebra, initial encounter for closed fracture: Secondary | ICD-10-CM | POA: Diagnosis not present

## 2023-04-20 DIAGNOSIS — S32119A Unspecified Zone I fracture of sacrum, initial encounter for closed fracture: Secondary | ICD-10-CM | POA: Diagnosis not present

## 2023-04-20 DIAGNOSIS — S36892A Contusion of other intra-abdominal organs, initial encounter: Secondary | ICD-10-CM | POA: Diagnosis not present

## 2023-04-20 DIAGNOSIS — E039 Hypothyroidism, unspecified: Secondary | ICD-10-CM | POA: Diagnosis not present

## 2023-04-20 DIAGNOSIS — S2243XA Multiple fractures of ribs, bilateral, initial encounter for closed fracture: Secondary | ICD-10-CM | POA: Diagnosis not present

## 2023-04-20 DIAGNOSIS — S2222XA Fracture of body of sternum, initial encounter for closed fracture: Secondary | ICD-10-CM | POA: Diagnosis not present

## 2023-04-20 DIAGNOSIS — R0689 Other abnormalities of breathing: Secondary | ICD-10-CM | POA: Diagnosis not present

## 2023-05-03 DIAGNOSIS — R609 Edema, unspecified: Secondary | ICD-10-CM | POA: Diagnosis not present

## 2023-05-03 DIAGNOSIS — Z09 Encounter for follow-up examination after completed treatment for conditions other than malignant neoplasm: Secondary | ICD-10-CM | POA: Diagnosis not present

## 2023-05-03 DIAGNOSIS — S2239XA Fracture of one rib, unspecified side, initial encounter for closed fracture: Secondary | ICD-10-CM | POA: Diagnosis not present

## 2023-05-03 DIAGNOSIS — Z6828 Body mass index (BMI) 28.0-28.9, adult: Secondary | ICD-10-CM | POA: Diagnosis not present

## 2023-05-11 DIAGNOSIS — T148XXA Other injury of unspecified body region, initial encounter: Secondary | ICD-10-CM | POA: Diagnosis not present

## 2023-05-11 DIAGNOSIS — S2222XD Fracture of body of sternum, subsequent encounter for fracture with routine healing: Secondary | ICD-10-CM | POA: Diagnosis not present

## 2023-05-11 DIAGNOSIS — S2241XD Multiple fractures of ribs, right side, subsequent encounter for fracture with routine healing: Secondary | ICD-10-CM | POA: Diagnosis not present

## 2023-05-11 DIAGNOSIS — S2242XD Multiple fractures of ribs, left side, subsequent encounter for fracture with routine healing: Secondary | ICD-10-CM | POA: Diagnosis not present

## 2023-05-24 DIAGNOSIS — S32110D Nondisplaced Zone I fracture of sacrum, subsequent encounter for fracture with routine healing: Secondary | ICD-10-CM | POA: Diagnosis not present

## 2023-05-31 DIAGNOSIS — T1490XA Injury, unspecified, initial encounter: Secondary | ICD-10-CM | POA: Diagnosis not present

## 2023-05-31 DIAGNOSIS — S32000S Wedge compression fracture of unspecified lumbar vertebra, sequela: Secondary | ICD-10-CM | POA: Diagnosis not present

## 2023-05-31 DIAGNOSIS — M545 Low back pain, unspecified: Secondary | ICD-10-CM | POA: Diagnosis not present

## 2023-06-09 DIAGNOSIS — E039 Hypothyroidism, unspecified: Secondary | ICD-10-CM | POA: Diagnosis not present

## 2023-06-09 DIAGNOSIS — Z0000 Encounter for general adult medical examination without abnormal findings: Secondary | ICD-10-CM | POA: Diagnosis not present

## 2023-06-09 DIAGNOSIS — Z780 Asymptomatic menopausal state: Secondary | ICD-10-CM | POA: Diagnosis not present

## 2023-06-09 DIAGNOSIS — E038 Other specified hypothyroidism: Secondary | ICD-10-CM | POA: Diagnosis not present

## 2023-06-21 DIAGNOSIS — Z13820 Encounter for screening for osteoporosis: Secondary | ICD-10-CM | POA: Diagnosis not present

## 2023-06-21 DIAGNOSIS — Z780 Asymptomatic menopausal state: Secondary | ICD-10-CM | POA: Diagnosis not present

## 2023-06-21 DIAGNOSIS — R92323 Mammographic fibroglandular density, bilateral breasts: Secondary | ICD-10-CM | POA: Diagnosis not present

## 2023-06-21 DIAGNOSIS — M810 Age-related osteoporosis without current pathological fracture: Secondary | ICD-10-CM | POA: Diagnosis not present

## 2023-06-21 DIAGNOSIS — Z1231 Encounter for screening mammogram for malignant neoplasm of breast: Secondary | ICD-10-CM | POA: Diagnosis not present

## 2023-06-28 DIAGNOSIS — Z8781 Personal history of (healed) traumatic fracture: Secondary | ICD-10-CM | POA: Diagnosis not present

## 2023-06-28 DIAGNOSIS — Z9181 History of falling: Secondary | ICD-10-CM | POA: Diagnosis not present

## 2023-06-28 DIAGNOSIS — E039 Hypothyroidism, unspecified: Secondary | ICD-10-CM | POA: Diagnosis not present

## 2023-06-28 DIAGNOSIS — M810 Age-related osteoporosis without current pathological fracture: Secondary | ICD-10-CM | POA: Diagnosis not present

## 2023-06-28 DIAGNOSIS — R269 Unspecified abnormalities of gait and mobility: Secondary | ICD-10-CM | POA: Diagnosis not present

## 2023-07-19 DIAGNOSIS — S32110D Nondisplaced Zone I fracture of sacrum, subsequent encounter for fracture with routine healing: Secondary | ICD-10-CM | POA: Diagnosis not present

## 2023-07-20 DIAGNOSIS — M545 Low back pain, unspecified: Secondary | ICD-10-CM | POA: Diagnosis not present

## 2023-07-20 DIAGNOSIS — Z8781 Personal history of (healed) traumatic fracture: Secondary | ICD-10-CM | POA: Diagnosis not present

## 2023-08-01 DIAGNOSIS — M810 Age-related osteoporosis without current pathological fracture: Secondary | ICD-10-CM | POA: Diagnosis not present

## 2023-08-09 DIAGNOSIS — H524 Presbyopia: Secondary | ICD-10-CM | POA: Diagnosis not present

## 2023-08-09 DIAGNOSIS — H25813 Combined forms of age-related cataract, bilateral: Secondary | ICD-10-CM | POA: Diagnosis not present

## 2023-08-11 DIAGNOSIS — E7800 Pure hypercholesterolemia, unspecified: Secondary | ICD-10-CM | POA: Diagnosis not present

## 2023-08-11 DIAGNOSIS — Z6828 Body mass index (BMI) 28.0-28.9, adult: Secondary | ICD-10-CM | POA: Diagnosis not present

## 2023-08-11 DIAGNOSIS — Z09 Encounter for follow-up examination after completed treatment for conditions other than malignant neoplasm: Secondary | ICD-10-CM | POA: Diagnosis not present

## 2023-08-31 DIAGNOSIS — M810 Age-related osteoporosis without current pathological fracture: Secondary | ICD-10-CM | POA: Diagnosis not present

## 2023-10-05 DIAGNOSIS — M810 Age-related osteoporosis without current pathological fracture: Secondary | ICD-10-CM | POA: Diagnosis not present

## 2023-11-09 DIAGNOSIS — M810 Age-related osteoporosis without current pathological fracture: Secondary | ICD-10-CM | POA: Diagnosis not present

## 2023-11-30 ENCOUNTER — Other Ambulatory Visit: Payer: Medicare Other

## 2023-12-11 DIAGNOSIS — Z23 Encounter for immunization: Secondary | ICD-10-CM | POA: Diagnosis not present

## 2024-01-15 DIAGNOSIS — M810 Age-related osteoporosis without current pathological fracture: Secondary | ICD-10-CM | POA: Diagnosis not present
